# Patient Record
Sex: Male | Born: 1950 | Race: White | Hispanic: No | Marital: Single | Smoking: Former smoker
Health system: Southern US, Community
[De-identification: ages and names within clinical notes are randomized; demographics above are authoritative.]

## PROBLEM LIST (undated history)

## (undated) DIAGNOSIS — H905 Unspecified sensorineural hearing loss: Secondary | ICD-10-CM

## (undated) DIAGNOSIS — J449 Chronic obstructive pulmonary disease, unspecified: Secondary | ICD-10-CM

## (undated) DIAGNOSIS — C801 Malignant (primary) neoplasm, unspecified: Secondary | ICD-10-CM

## (undated) DIAGNOSIS — R972 Elevated prostate specific antigen [PSA]: Secondary | ICD-10-CM

## (undated) HISTORY — DX: Chronic obstructive pulmonary disease, unspecified: J44.9

## (undated) HISTORY — DX: Unspecified sensorineural hearing loss: H90.5

## (undated) HISTORY — PX: FRACTURE SURGERY: SHX138

---

## 2006-10-23 ENCOUNTER — Ambulatory Visit: Payer: Self-pay | Admitting: Internal Medicine

## 2006-11-24 ENCOUNTER — Ambulatory Visit: Payer: Self-pay | Admitting: Internal Medicine

## 2006-11-28 ENCOUNTER — Encounter: Payer: Self-pay | Admitting: Internal Medicine

## 2006-11-28 DIAGNOSIS — H905 Unspecified sensorineural hearing loss: Secondary | ICD-10-CM

## 2006-11-28 DIAGNOSIS — J449 Chronic obstructive pulmonary disease, unspecified: Secondary | ICD-10-CM | POA: Insufficient documentation

## 2007-01-26 ENCOUNTER — Ambulatory Visit: Payer: Self-pay | Admitting: Internal Medicine

## 2007-01-27 ENCOUNTER — Encounter (INDEPENDENT_AMBULATORY_CARE_PROVIDER_SITE_OTHER): Payer: Self-pay | Admitting: Internal Medicine

## 2007-01-27 LAB — CONVERTED CEMR LAB
Hemoglobin: 14.7 g/dL (ref 13.0–17.0)
Lymphocytes Relative: 36 % (ref 12–46)
Lymphs Abs: 2.5 10*3/uL (ref 0.7–3.3)
MCHC: 32.7 g/dL (ref 30.0–36.0)
Monocytes Absolute: 0.7 10*3/uL (ref 0.2–0.7)
Neutro Abs: 3.3 10*3/uL (ref 1.7–7.7)
Platelets: 242 10*3/uL (ref 150–400)
RBC: 4.82 M/uL (ref 4.22–5.81)
RDW: 13 % (ref 11.5–14.0)

## 2007-02-16 ENCOUNTER — Encounter (INDEPENDENT_AMBULATORY_CARE_PROVIDER_SITE_OTHER): Payer: Self-pay | Admitting: Internal Medicine

## 2007-02-22 ENCOUNTER — Encounter (INDEPENDENT_AMBULATORY_CARE_PROVIDER_SITE_OTHER): Payer: Self-pay | Admitting: Internal Medicine

## 2007-04-20 ENCOUNTER — Encounter (INDEPENDENT_AMBULATORY_CARE_PROVIDER_SITE_OTHER): Payer: Self-pay | Admitting: Internal Medicine

## 2010-11-27 ENCOUNTER — Inpatient Hospital Stay (HOSPITAL_COMMUNITY)
Admission: EM | Admit: 2010-11-27 | Discharge: 2010-11-29 | Payer: Self-pay | Source: Home / Self Care | Admitting: Emergency Medicine

## 2010-11-28 ENCOUNTER — Encounter: Payer: Self-pay | Admitting: Orthopedic Surgery

## 2010-12-02 ENCOUNTER — Ambulatory Visit: Payer: Self-pay | Admitting: Orthopedic Surgery

## 2010-12-02 DIAGNOSIS — S8263XA Displaced fracture of lateral malleolus of unspecified fibula, initial encounter for closed fracture: Secondary | ICD-10-CM | POA: Insufficient documentation

## 2010-12-02 HISTORY — DX: Displaced fracture of lateral malleolus of unspecified fibula, initial encounter for closed fracture: S82.63XA

## 2010-12-08 ENCOUNTER — Ambulatory Visit: Payer: Self-pay | Admitting: Orthopedic Surgery

## 2010-12-15 ENCOUNTER — Encounter: Payer: Self-pay | Admitting: Orthopedic Surgery

## 2010-12-16 ENCOUNTER — Encounter: Payer: Self-pay | Admitting: Orthopedic Surgery

## 2011-01-06 ENCOUNTER — Ambulatory Visit
Admission: RE | Admit: 2011-01-06 | Discharge: 2011-01-06 | Payer: Self-pay | Source: Home / Self Care | Attending: Orthopedic Surgery | Admitting: Orthopedic Surgery

## 2011-01-25 NOTE — Assessment & Plan Note (Signed)
Summary: HOSP FOL/UP RT ANKLE/OTIF POST OP/SURG 11/28/10/[employer]F   Visit Type:  Follow-up  CC:  post op 1.  History of Present Illness: I saw Jose Villa in the office today for a followup visit.  He is a 60 years old man with the complaint of:  post op 1 otif right ankle.  DOS 11/28/10.  POD 4.  Meds: Percocet,  no refill needed.  Today for a dressing change, splint change.  Using walker today, no weightbearing.   Jose Villa has some swelling and serous drainage from his wound.  His foot is in neutral position with slight 5 dorsiflexion and about 10 plantar flexion.  He is placed back into her posterior splint in the neutral position he is to follow up to get x-rays and staples out on the 14th.        Allergies: No Known Drug Allergies   Impression & Recommendations:  Problem # 1:  AFTERCARE FOLLOW SURGERY MUSCULOSKEL SYSTEM NEC (ICD-V58.78)  Orders: Post-Op Check (40981)  Problem # 2:  CLOSED FRACTURE OF LATERAL MALLEOLUS (ICD-824.2)  Orders: Post-Op Check (19147) Short Arm Splint Static (82956)  Patient Instructions: 1)  return on the 14th for stples out and xrays  2)  no weight bearing    Orders Added: 1)  Post-Op Check [99024] 2)  Short Arm Splint Static [29125]

## 2011-01-27 NOTE — Assessment & Plan Note (Signed)
Summary: 4 WK RE-CK/XRAYS OUT OF CAST RT ANKLE/POST OP 11/28/10/[employer]BS/CAF   Visit Type:  Follow-up  CC:  right ankle fracture.  History of Present Illness: I saw Jose Villa in the office today for a followup visit.  He is a 60 years old man with the complaint of:  right ankle fracture  Post op 1 otif right ankle.  DOS 11/28/10.  this is 5 weeks and 4 days since his surgery  Meds: Percocet 5-325 Mg Tabs (Oxycodone-acetaminophen) .Marland Kitchen.. 1 q 4 as needed pain  Today for xrays OOP  Complaints: He ran out of pain medicine and has been having pain.  His x-rays are taken again today after his wound was examined and found to be healing nicely  Separately identifiable x-ray report 3 views RIGHT ankle  Lateral fixation an interfrag screw was noted in the lateral malleolar fracture  The ankle mortise is intact  Impression healing ankle fracture with internal fixation  The patient is placed in an air cast allowed to weight-bear as tolerated in a regular shoe or boot and come back for x-rays in one month  Allergies: No Known Drug Allergies   Impression & Recommendations:  Problem # 1:  AFTERCARE FOLLOW SURGERY MUSCULOSKEL SYSTEM NEC (ICD-V58.78)  Orders: Post-Op Check ([address]) Ankle x-ray complete,  minimum 3 views (60454)  Problem # 2:  CLOSED FRACTURE OF LATERAL MALLEOLUS (ICD-824.2)  Orders: Post-Op Check (09811) Ankle x-ray complete,  minimum 3 views (91478)  Patient Instructions: 1)  WBAT in the brace use the walker as needed  2)  return in 4 weeks for xrays right ankle  Prescriptions: PERCOCET 5-325 MG TABS (OXYCODONE-ACETAMINOPHEN) 1 q 4 as needed pain  #60 x 0   Entered and Authorized by:   Fuller Canada MD   Signed by:   Fuller Canada MD on 01/06/2011   Method used:   Print then Give to Patient   RxID:   [id number]    Orders Added: 1)  Post-Op Check [62952] 2)  Ankle x-ray complete,  minimum 3 views [84132]

## 2011-01-27 NOTE — Letter (Signed)
Summary: FMLA FORM   FMLA FORM   Imported By: Eugenio Hoes 12/29/2010 12:00:11  _____________________________________________________________________  External Attachment:    Type:   Image     Comment:   External Document

## 2011-01-27 NOTE — Letter (Signed)
Summary: work note faxed to employer  work note faxed to employer   Imported By: Jacklynn Ganong 12/21/2010 10:25:24  _____________________________________________________________________  External Attachment:    Type:   Image     Comment:   External Document

## 2011-01-27 NOTE — Letter (Signed)
Summary: Out of Work  [facility]  [address]  [place], [place] [address]   Phone: [phone]  Fax: [phone]    December 08, 2010   Employee:  Jose Villa    To Whom It May Concern:   For Medical reasons, please excuse the above named employee from work for the following dates:  Start:   11/27/10                    End/Estimated out of work:  12 weeks    Approxiimate return to work date:  02/28/11     If you need additional information, please feel free to contact our office.         Sincerely,    Terrance Mass, MD

## 2011-01-27 NOTE — Assessment & Plan Note (Signed)
Summary: RE-CK/XRAY RT ANKLE/POST OP 11/28/10/[employer]F   Visit Type:  Follow-up  CC:  post op ankle.  History of Present Illness: I saw Jose Villa in the office today for a followup visit.  He is a 60 years old man with the complaint of:  post op 1 otif right ankle.  DOS 11/28/10.  POD 10  Meds: Percocet takes 2 sometimes, needs refill.  Today for staple removal, xrays, dressing change.  Using walker today, no weightbearing.  Painlevel is 6 now.  Wound looks good. Staples were removed. Steri-Strips were applied.  Swelling has gone down to allow cast application.  Patient placed in nonweiof the RIGHT ankle. There is a plate, fixing a lateral malleolar fracture with interfrag screw. Overall alignment looks good. Fracture appears to be in stable condition. impression stable fixation, RIGHT ankle fracture           Allergies: No Known Drug Allergies   Impression & Recommendations:  Problem # 1:  AFTERCARE FOLLOW SURGERY MUSCULOSKEL SYSTEM NEC (ICD-V58.78) Assessment Improved  Orders: Post-Op Check (04540) Ankle x-ray complete,  minimum 3 views (98119)  Problem # 2:  CLOSED FRACTURE OF LATERAL MALLEOLUS (ICD-824.2) Assessment: Improved  Orders: Post-Op Check (14782) Ankle x-ray complete,  minimum 3 views (95621)  Medications Added to Medication List This Visit: 1)  Percocet 5-325 Mg Tabs (Oxycodone-acetaminophen) .Marland Kitchen.. 1 q 4 as needed pain  Patient Instructions: 1)  no weight bearing  2)  return in 4 weeks x-rays OUT OF CAST  Prescriptions: PERCOCET 5-325 MG TABS (OXYCODONE-ACETAMINOPHEN) 1 q 4 as needed pain  #40 x 0   Entered and Authorized by:   Fuller Canada MD   Signed by:   Fuller Canada MD on 12/08/2010   Method used:   Print then Give to Patient   RxID:   [id number]    Orders Added: 1)  Post-Op Check [41324] 2)  Ankle x-ray complete,  minimum 3 views [40102]

## 2011-02-08 ENCOUNTER — Ambulatory Visit (INDEPENDENT_AMBULATORY_CARE_PROVIDER_SITE_OTHER): Payer: BC Managed Care – PPO | Admitting: Orthopedic Surgery

## 2011-02-08 ENCOUNTER — Encounter: Payer: Self-pay | Admitting: Orthopedic Surgery

## 2011-02-08 DIAGNOSIS — S8263XA Displaced fracture of lateral malleolus of unspecified fibula, initial encounter for closed fracture: Secondary | ICD-10-CM

## 2011-02-08 DIAGNOSIS — Z4789 Encounter for other orthopedic aftercare: Secondary | ICD-10-CM

## 2011-02-16 NOTE — Assessment & Plan Note (Signed)
Summary: 4 wk reck/xr rt ankle post op 11/28/10/bcbs/caf   Visit Type:  Follow-up  CC:  fx care post op.  History of Present Illness: I saw Jose Villa in the office today for a followup visit.  He is a 60 years old man with the complaint of:  right ankle fracture  Post op  otif right ankle.  DOS 11/28/10.  Meds :nothing for pain.  Today for xrays since been in air cast for a month.  Wears sometimes.  No problems, not much pain.  Ankle looks good. We took x-rays today.  X-ray show that the fracture is healed.  He can return to work on the 27th of as needed    Allergies: No Known Drug Allergies  Physical Exam  Additional Exam:  ankle range of motion 10 dorsiflexion, 20, plantar flexion. Incision is good. No swelling.  No tenderness at the fracture   Impression & Recommendations:  Problem # 1:  AFTERCARE FOLLOW SURGERY MUSCULOSKEL SYSTEM NEC (ICD-V58.78)  Separate and Identifiable X-Ray report       3 views right ankle   Lateral plate with interfragmentary screw fixating lateral malleolar fracture, which is healed. Mortise is intact.  Impression healed ankle fracture, RIGHT ankle.  Orders: Post-Op Check [phone]) Ankle x-ray complete,  minimum 3 views (60454)  Problem # 2:  CLOSED FRACTURE OF LATERAL MALLEOLUS (ICD-824.2) Assessment: Comment Only  Orders: Post-Op Check (09811) Ankle x-ray complete,  minimum 3 views (91478)  Patient Instructions: 1)  RETURN TO WORK ON THE 27TH OF FEBRUARY    Orders Added: 1)  Post-Op Check [99024] 2)  Ankle x-ray complete,  minimum 3 views [29562]

## 2011-02-16 NOTE — Letter (Signed)
Summary: Return to work form  Return to work form   Imported By: Jacklynn Ganong 02/08/2011 15:53:15  _____________________________________________________________________  External Attachment:    Type:   Image     Comment:   External Document

## 2011-02-16 NOTE — Letter (Signed)
Summary: Out of Work  [facility]  [address]  [place], [place] [address]   Phone: [phone]  Fax: [phone]    February 08, 2011   Employee:  Jose Villa    To Whom It May Concern:   For Medical reasons, please excuse the above named employee from work for the following dates:  Start:  11/27/10       End/Return to work:   02/21/11, full duty, no restrictions.   If you need additional information, please feel free to contact our office.         Sincerely,    Terrance Mass, MD

## 2011-03-08 LAB — PROTIME-INR: Prothrombin Time: 13.1 seconds (ref 11.6–15.2)

## 2011-03-08 LAB — CBC
Hemoglobin: 14.5 g/dL (ref 13.0–17.0)
MCHC: 35.6 g/dL (ref 30.0–36.0)
Platelets: 262 10*3/uL (ref 150–400)
RBC: 4.35 MIL/uL (ref 4.22–5.81)
WBC: 11 10*3/uL — ABNORMAL HIGH (ref 4.0–10.5)

## 2011-03-08 LAB — BASIC METABOLIC PANEL
BUN: 11 mg/dL (ref 6–23)
CO2: 29 mEq/L (ref 19–32)
Calcium: 9.1 mg/dL (ref 8.4–10.5)
Chloride: 99 mEq/L (ref 96–112)
GFR calc non Af Amer: >60 mL/min (ref >60)
Sodium: 133 mEq/L — ABNORMAL LOW (ref 135–145)

## 2011-03-08 LAB — DIFFERENTIAL
Basophils Absolute: 0.1 10*3/uL (ref 0.0–0.1)
Eosinophils Absolute: 0.1 10*3/uL (ref 0.0–0.7)
Eosinophils Relative: 1 % (ref 0–5)
Monocytes Absolute: 1.1 10*3/uL — ABNORMAL HIGH (ref 0.1–1.0)
Monocytes Relative: 10 % (ref 3–12)
Neutrophils Relative %: 70 % (ref 43–77)

## 2011-08-30 ENCOUNTER — Encounter: Payer: Self-pay | Admitting: Family Medicine

## 2011-08-30 ENCOUNTER — Ambulatory Visit (INDEPENDENT_AMBULATORY_CARE_PROVIDER_SITE_OTHER): Payer: BC Managed Care – PPO | Admitting: Family Medicine

## 2011-08-30 VITALS — BP 124/90 | HR 83 | Resp 16 | Ht 71.0 in | Wt 203.1 lb

## 2011-08-30 DIAGNOSIS — Z720 Tobacco use: Secondary | ICD-10-CM

## 2011-08-30 DIAGNOSIS — F17200 Nicotine dependence, unspecified, uncomplicated: Secondary | ICD-10-CM

## 2011-08-30 DIAGNOSIS — H6120 Impacted cerumen, unspecified ear: Secondary | ICD-10-CM

## 2011-08-30 DIAGNOSIS — J449 Chronic obstructive pulmonary disease, unspecified: Secondary | ICD-10-CM

## 2011-08-30 MED ORDER — ALBUTEROL SULFATE HFA 108 (90 BASE) MCG/ACT IN AERS: 2.0000 | INHALATION_SPRAY | RESPIRATORY_TRACT | Status: DC | PRN

## 2011-08-30 MED ORDER — CARBAMIDE PEROXIDE 6.5 % OT SOLN: 5.0000 [drp] | Freq: Two times a day (BID) | OTIC | Status: DC

## 2011-08-30 MED ORDER — VARENICLINE TARTRATE 0.5 MG X 11 & 1 MG X 42 PO MISC: ORAL | Status: AC

## 2011-08-30 NOTE — Progress Notes (Signed)
  Subjective:    Patient ID: Jose Villa, male    DOB: [birth year], 60 y.o.   MRN: [id number]  HPI  Here to establish care, medications and history reviewed   He has a DOT exam coming up and wanted to establish care, he would like to have his ears cleaned out today   Works as a heavy equipment--   Tobacco- tried chantix in the past, but had problem with his breathing and coughing in the past after he quit for approx 3 months. Smokes 1.5ppd > 30 years , would like to try again   COPD- coughs more with humidity and weather, occ has cough while he is smoking, typically does not have a lot of phlegm production, no current inhalers, his family has been worried about his breathing, he has not had formal work-up   Hearing loss- feels like he has wax build-up today, he has less hearing on right side compared to left, history of hearing loss per chart he, has passed his DOT exams and hearing exams since that diagnosis, occ has pain in right ear   No history of DM, HTN  Review of Systems   GEN- denies fatigue, fever, weight loss,weakness, recent illness HEENT- denies eye drainage, change in vision, nasal discharge, CVS- denies chest pain, palpitations RESP- denies SOB, cough, wheeze ABD- denies N/V, change in stools, abd pain GU- denies dysuria, hematuria, dribbling, incontinence MSK- denies joint pain, muscle aches, injury Neuro- denies headache, dizziness, syncope, seizure activity      Objective:   Physical Exam  GEN- NAD, alert and oriented x3 HEENT- PERRL, EOMI, non injected sclera, pink conjunctiva, MMM, oropharynx clear, fundoscopic exam benign, impacted wax bilat  Neck- Supple, no thryomegaly CVS- RRR, no murmur RESP-bilat expiratory and inspiratory wheeze, fair air movement, no rhonchi, normal WOB, oxygen sat 93% RA, increased end expiratory time EXT- No edema Pulses- Radial, DP- 2+        Assessment & Plan:

## 2011-08-30 NOTE — Assessment & Plan Note (Signed)
Start Chantix patient will have a quit date in mind. He understands he not cannot continue to smoke while on this medication

## 2011-08-30 NOTE — Assessment & Plan Note (Signed)
Debrox drops for 3 days and return to have ears cleaned out. He will have formal hearing exam as a part of his DOT exam in [facility]

## 2011-08-30 NOTE — Patient Instructions (Addendum)
Please get the PFT done as soon as possible Use the albuterol as needed every 4 hours for cough or wheeze I recommend that you quit smoking!! Start the Chantix For your ears- use the debrox- wax drops in both ears , then return for a nurse visit on Friday to have them cleaned out Return for a f/u after your PFT are done. You will need to schedule a physical exam

## 2011-08-30 NOTE — Assessment & Plan Note (Signed)
He has not been followed for a COPD and continues to smoke a large quantity daily. I will start him on albuterol nhaler at this time. 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 I'm sure he will need a maintenance inhaler. His oxygen levels currently low normal tensive and he is not hypoxic or in any respiratory distress at this time.

## 2011-09-12 ENCOUNTER — Ambulatory Visit (HOSPITAL_COMMUNITY)
Admission: RE | Admit: 2011-09-12 | Discharge: 2011-09-12 | Disposition: A | Discharge status: Home / Self Care | Payer: BC Managed Care – PPO | Source: Ambulatory Visit | Attending: Family Medicine | Admitting: Family Medicine

## 2011-09-12 DIAGNOSIS — R0609 Other forms of dyspnea: Secondary | ICD-10-CM | POA: Insufficient documentation

## 2011-09-12 DIAGNOSIS — J449 Chronic obstructive pulmonary disease, unspecified: Secondary | ICD-10-CM | POA: Insufficient documentation

## 2011-09-12 DIAGNOSIS — R0989 Other specified symptoms and signs involving the circulatory and respiratory systems: Secondary | ICD-10-CM | POA: Insufficient documentation

## 2011-09-12 DIAGNOSIS — J4489 Other specified chronic obstructive pulmonary disease: Secondary | ICD-10-CM | POA: Insufficient documentation

## 2011-09-12 LAB — PULMONARY FUNCTION TEST

## 2011-09-12 MED ORDER — ALBUTEROL SULFATE (5 MG/ML) 0.5% IN NEBU
2.5000 mg | INHALATION_SOLUTION | Freq: Once | RESPIRATORY_TRACT | Status: AC
  Administered 2011-09-12: 2.5 mg via RESPIRATORY_TRACT

## 2011-09-15 NOTE — Procedures (Signed)
NAME:  SHALAMAR, CRAYS NO.:  [id number]  MEDICAL RECORD NO.:  [id number]  LOCATION:  RESP                          FACILITY:  [facility]  PHYSICIAN:  Kieon Lawhorn L. Juanetta Gosling, M.D.DATE OF BIRTH:  08-05-1951  DATE OF PROCEDURE: DATE OF DISCHARGE:  09/12/2011                           PULMONARY FUNCTION TEST   REASON FOR PULMONARY FUNCTION TESTING:  COPD. 1. Spirometry shows a severe ventilatory defect with evidence of     airflow obstruction. 2. Lung volumes are normal. 3. DLCO is normal. 4. There is improvement with inhaled bronchodilator, but it does not     reach the level of significance. 5. This is consistent with clinical diagnosis of COPD.     Kailin Leu L. Juanetta Gosling, M.D.     ELH/MEDQ  D:  09/15/2011  T:  09/15/2011  Job:  [id number]  cc:   Milus Mallick. Lodema Hong, M.D. Fax: [phone]

## 2011-09-26 ENCOUNTER — Encounter: Payer: Self-pay | Admitting: Family Medicine

## 2011-09-26 ENCOUNTER — Ambulatory Visit (INDEPENDENT_AMBULATORY_CARE_PROVIDER_SITE_OTHER): Payer: BC Managed Care – PPO | Admitting: Family Medicine

## 2011-09-26 DIAGNOSIS — Z720 Tobacco use: Secondary | ICD-10-CM

## 2011-09-26 DIAGNOSIS — J449 Chronic obstructive pulmonary disease, unspecified: Secondary | ICD-10-CM

## 2011-09-26 DIAGNOSIS — F17200 Nicotine dependence, unspecified, uncomplicated: Secondary | ICD-10-CM

## 2011-09-26 DIAGNOSIS — Z7689 Persons encountering health services in other specified circumstances: Secondary | ICD-10-CM | POA: Insufficient documentation

## 2011-09-26 DIAGNOSIS — E669 Obesity, unspecified: Secondary | ICD-10-CM | POA: Insufficient documentation

## 2011-09-26 DIAGNOSIS — Z125 Encounter for screening for malignant neoplasm of prostate: Secondary | ICD-10-CM

## 2011-09-26 MED ORDER — FLUTICASONE-SALMETEROL 250-50 MCG/DOSE IN AEPB: 1.0000 | INHALATION_SPRAY | Freq: Two times a day (BID) | RESPIRATORY_TRACT | Status: DC

## 2011-09-26 NOTE — Patient Instructions (Signed)
Schedule a physical exam for Jan or Feb 2012 Start the Advair twice a day- rinse your mouth after use  Get your blood work done before the physical- do not eat after midnight  Chronic Obstructive Pulmonary Disease (COPD) Chronic obstructive pulmonary disease (COPD) is a condition in which airflow from the lungs is restricted. The lungs can never return to normal, but there are measures you can take which will improve them and make you feel better. CAUSES  Smoking.   Breathing in irritants (pollution, cigarette smoke, strong odors, aerosol sprays, paint fumes).   History of lung infections.  TREATMENT  Treatment focuses on making you comfortable (supportive care).  HOME CARE INSTRUCTIONS  If you smoke, stop smoking. The carbon monoxide buildup in the blood robs you of your already short oxygen supply.   Take medicines (antibiotics) that kill germs as directed.   Avoid antihistamines and cough syrups. They dry up your system and slow down the elimination of secretions. This decreases respiratory capacity and may lead to infections.   Drink enough water and fluids to keep your urine clear or pale yellow. This loosens secretions.   Use humidifiers at home and at your bedside if they do not make breathing difficult.   Receive all protective vaccines your caregiver suggests, especially pneumococcal and influenza.   Use home oxygen as suggested.  SEEK MEDICAL CARE IF:  You develop pus-like mucus (sputum).   You have an oral temperature above 101F.   Breathing is more labored or exercise becomes difficult to do.   You are running out of the medicine you take for your breathing.  SEEK IMMEDIATE MEDICAL CARE IF:  You have a rapid heart rate.   You have agitation, confusion, tremors, or are in a stupor (family members may need to observe this).   It becomes difficult to breathe.   You develop chest pain.   You have an oral temperature above 101F, not controlled by medicine.    MAKE SURE YOU:    Understand these instructions.   Will watch your condition.   Will get help right away if you are not doing well or get worse.  Document Released: 09/21/2005 Document Re-Released: 03/08/2010 [facility] Patient Information 2011 [place], [place].

## 2011-09-26 NOTE — Progress Notes (Signed)
  Subjective:    Patient ID: Jose Villa, male    DOB: 01/27/1951, 60 y.o.   MRN: [id number]  HPI  Pt here to f/u COPD and PFT PFT showed severe defect, positive for COPD, little reversal with bronchodilator Feels good overall Has not smoked in 2 weeks, on Chantix Used albuterol a few times  Review of Systems    GEN- No fever, no weight loss, +fatigue since he quit smoking     CVS- no CP, no palipitations, no leg edema     RESP-  +cough, +wheeze, +SOB    Objective:   Physical Exam  GEN- NAD, alert and oriented x3 CVS- RRR, no murmur RESP-bilat expiratory and inspiratory wheeze, fair air movement, no rhonchi, normal WOB,  EXT- No edema       Assessment & Plan:

## 2011-09-27 NOTE — Assessment & Plan Note (Signed)
Will start advair for maintenance. Pt declined flu shot, albuterol prn Discussed long term effects of tobacco

## 2011-09-27 NOTE — Assessment & Plan Note (Signed)
Pt congratulated on tobacco cessation, continue Chantix

## 2011-10-05 ENCOUNTER — Encounter: Payer: Self-pay | Admitting: Family Medicine

## 2011-12-16 LAB — LIPID PANEL
Cholesterol: 180 mg/dL (ref 0–200)
HDL: 38 mg/dL — ABNORMAL LOW (ref >39)
Total CHOL/HDL Ratio: 4.7 Ratio
Triglycerides: 78 mg/dL (ref <150)

## 2011-12-16 LAB — COMPREHENSIVE METABOLIC PANEL
AST: 16 U/L (ref 0–37)
Albumin: 4.7 g/dL (ref 3.5–5.2)
Alkaline Phosphatase: 70 U/L (ref 39–117)
Calcium: 9.7 mg/dL (ref 8.4–10.5)
Glucose, Bld: 88 mg/dL (ref 70–99)
Total Protein: 7 g/dL (ref 6.0–8.3)

## 2011-12-16 LAB — PSA: PSA: 1.04 ng/mL (ref <=4.00)

## 2012-04-29 IMAGING — CR DG CHEST 1V PORT
1 series · 1 of 1 positions shown · non-contrast
Comparison: None.

CLINICAL DATA: Preoperative respiratory exam.  Ankle fracture.

PORTABLE CHEST - 1 VIEW

[view not recorded]
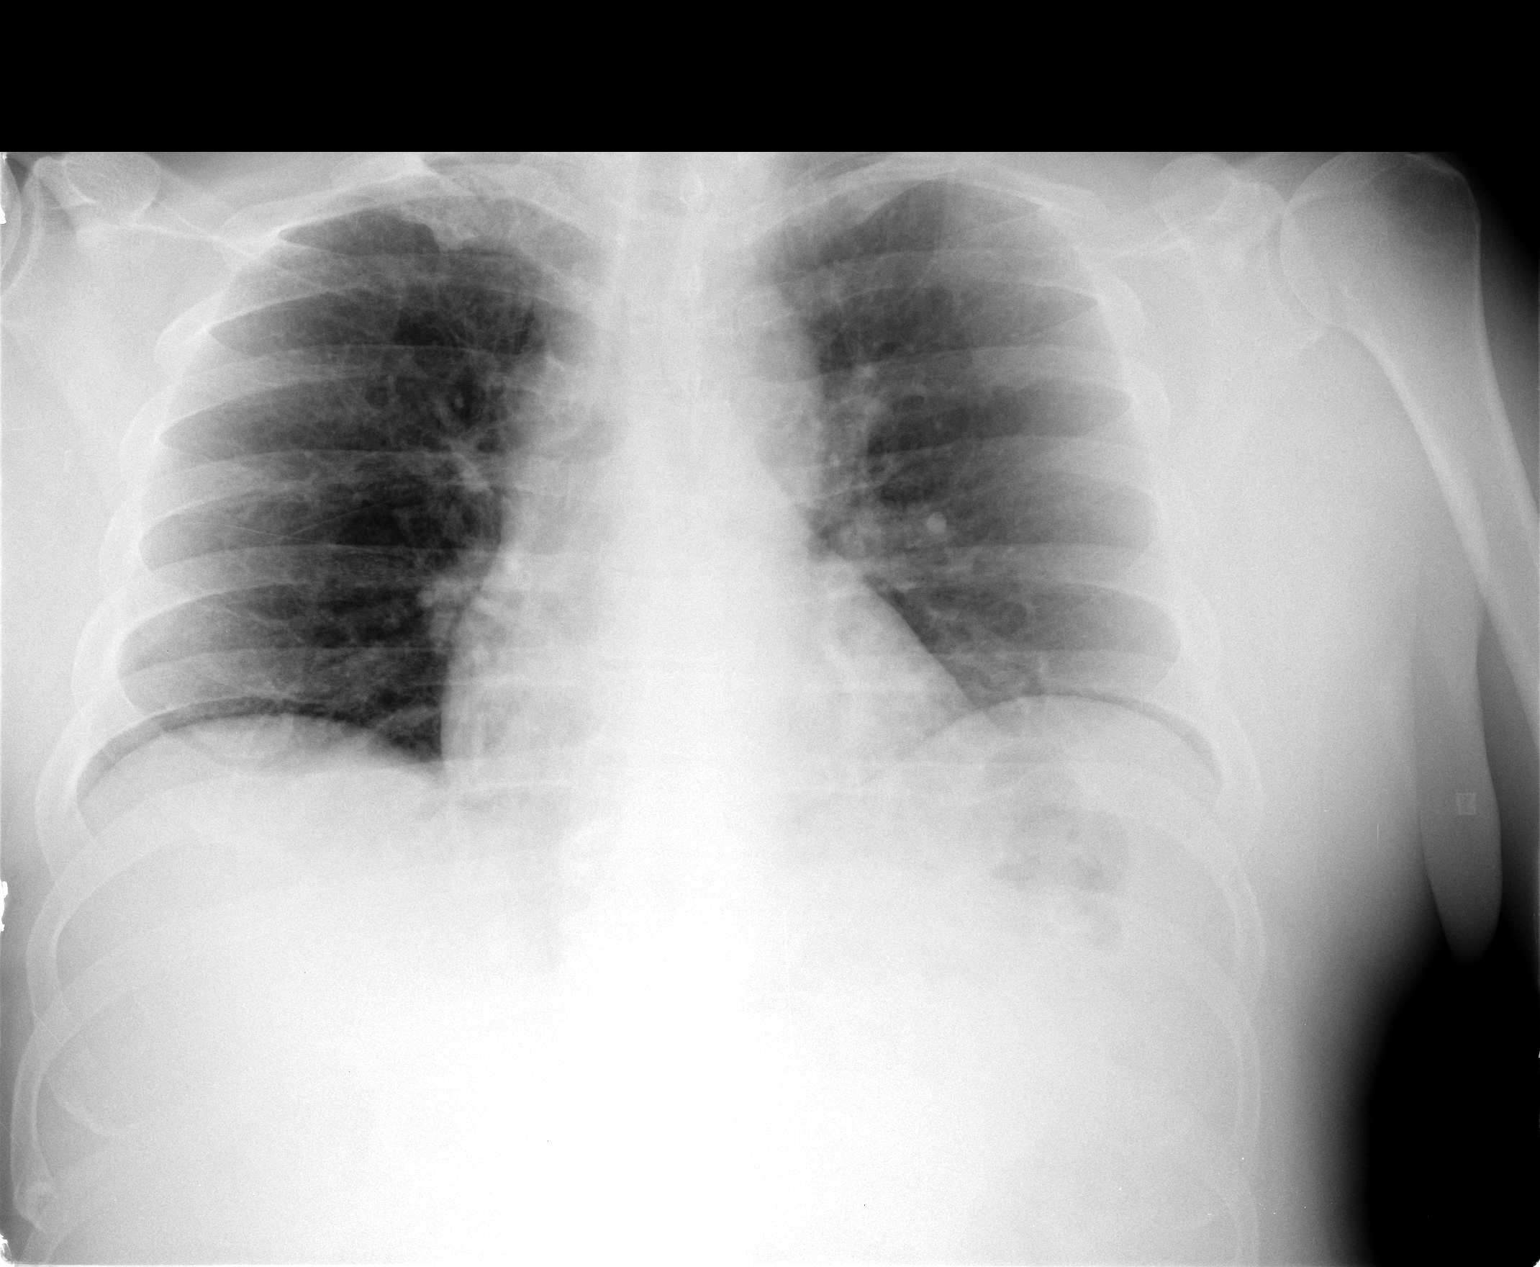

[1 of 1 positions shown; findings below may reference images not displayed]

FINDINGS: The heart size and vascularity are normal and the lungs
are clear.  No significant osseous abnormality.
IMPRESSION: No acute disease in the chest.

## 2019-02-13 ENCOUNTER — Encounter (HOSPITAL_COMMUNITY): Payer: Self-pay | Admitting: Emergency Medicine

## 2019-02-13 ENCOUNTER — Emergency Department (HOSPITAL_COMMUNITY)
Admission: EM | Admit: 2019-02-13 | Discharge: 2019-02-13 | Discharge status: Against Medical Advice | Payer: BLUE CROSS/BLUE SHIELD | Attending: Emergency Medicine | Admitting: Emergency Medicine

## 2019-02-13 ENCOUNTER — Other Ambulatory Visit: Payer: Self-pay

## 2019-02-13 DIAGNOSIS — J441 Chronic obstructive pulmonary disease with (acute) exacerbation: Secondary | ICD-10-CM | POA: Diagnosis not present

## 2019-02-13 DIAGNOSIS — Z87891 Personal history of nicotine dependence: Secondary | ICD-10-CM | POA: Insufficient documentation

## 2019-02-13 DIAGNOSIS — J181 Lobar pneumonia, unspecified organism: Secondary | ICD-10-CM | POA: Diagnosis not present

## 2019-02-13 DIAGNOSIS — Z79899 Other long term (current) drug therapy: Secondary | ICD-10-CM | POA: Diagnosis not present

## 2019-02-13 DIAGNOSIS — J189 Pneumonia, unspecified organism: Secondary | ICD-10-CM

## 2019-02-13 DIAGNOSIS — R0602 Shortness of breath: Secondary | ICD-10-CM | POA: Diagnosis present

## 2019-02-13 LAB — CBC WITH DIFFERENTIAL/PLATELET
Abs Immature Granulocytes: 0.08 10*3/uL — ABNORMAL HIGH (ref 0.00–0.07)
Basophils Absolute: 0 10*3/uL (ref 0.0–0.1)
Basophils Relative: 0 %
Eosinophils Absolute: 0 10*3/uL (ref 0.0–0.5)
Eosinophils Relative: 0 %
HCT: 52.2 % — ABNORMAL HIGH (ref 39.0–52.0)
Hemoglobin: 16.7 g/dL (ref 13.0–17.0)
Immature Granulocytes: 1 %
Lymphocytes Relative: 6 %
Lymphs Abs: 0.4 10*3/uL — ABNORMAL LOW (ref 0.7–4.0)
MCH: 32.4 pg (ref 26.0–34.0)
MCHC: 32 g/dL (ref 30.0–36.0)
MCV: 101.4 fL — ABNORMAL HIGH (ref 80.0–100.0)
Monocytes Absolute: 0.2 10*3/uL (ref 0.1–1.0)
Monocytes Relative: 3 %
Neutro Abs: 6.5 10*3/uL (ref 1.7–7.7)
Neutrophils Relative %: 90 %
Platelets: 157 10*3/uL (ref 150–400)
RBC: 5.15 MIL/uL (ref 4.22–5.81)
RDW: 13 % (ref 11.5–15.5)
WBC: 7.2 10*3/uL (ref 4.0–10.5)
nRBC: 0 % (ref 0.0–0.2)

## 2019-02-13 LAB — BRAIN NATRIURETIC PEPTIDE: B Natriuretic Peptide: 34 pg/mL (ref 0.0–100.0)

## 2019-02-13 LAB — BASIC METABOLIC PANEL
Anion gap: 11 (ref 5–15)
BUN: 13 mg/dL (ref 8–23)
CO2: 30 mmol/L (ref 22–32)
Calcium: 8.8 mg/dL — ABNORMAL LOW (ref 8.9–10.3)
Chloride: 94 mmol/L — ABNORMAL LOW (ref 98–111)
Creatinine, Ser: 0.79 mg/dL (ref 0.61–1.24)
GFR calc Af Amer: >60 mL/min (ref >60)
GFR calc non Af Amer: >60 mL/min (ref >60)
Glucose, Bld: 201 mg/dL — ABNORMAL HIGH (ref 70–99)
Potassium: 3.6 mmol/L (ref 3.5–5.1)
Sodium: 135 mmol/L (ref 135–145)

## 2019-02-13 LAB — D-DIMER, QUANTITATIVE (NOT AT ARMC): D-Dimer, Quant: 0.47 ug/mL-FEU (ref 0.00–0.50)

## 2019-02-13 MED ORDER — SODIUM CHLORIDE 0.9 % IV SOLN
1.0000 g | Freq: Once | INTRAVENOUS | Status: AC
  Administered 2019-02-13: 1 g via INTRAVENOUS
  Filled 2019-02-13: qty 10

## 2019-02-13 MED ORDER — ALBUTEROL SULFATE HFA 108 (90 BASE) MCG/ACT IN AERS
2.0000 | INHALATION_SPRAY | Freq: Once | RESPIRATORY_TRACT | Status: AC
  Administered 2019-02-13: 2 via RESPIRATORY_TRACT
  Filled 2019-02-13: qty 6.7

## 2019-02-13 MED ORDER — AZITHROMYCIN 250 MG PO TABS
500.0000 mg | ORAL_TABLET | Freq: Once | ORAL | Status: AC
  Administered 2019-02-13: 500 mg via ORAL
  Filled 2019-02-13: qty 2

## 2019-02-13 MED ORDER — IPRATROPIUM BROMIDE 0.02 % IN SOLN
0.5000 mg | Freq: Once | RESPIRATORY_TRACT | Status: AC
  Administered 2019-02-13: 0.5 mg via RESPIRATORY_TRACT
  Filled 2019-02-13: qty 2.5

## 2019-02-13 MED ORDER — ALBUTEROL (5 MG/ML) CONTINUOUS INHALATION SOLN
10.0000 mg/h | INHALATION_SOLUTION | RESPIRATORY_TRACT | Status: DC
  Administered 2019-02-13: 10 mg/h via RESPIRATORY_TRACT
  Filled 2019-02-13: qty 20

## 2019-02-13 MED ORDER — METHYLPREDNISOLONE SODIUM SUCC 125 MG IJ SOLR
62.5000 mg | Freq: Once | INTRAMUSCULAR | Status: AC
  Administered 2019-02-13: 62.5 mg via INTRAVENOUS
  Filled 2019-02-13: qty 2

## 2019-02-13 NOTE — ED Notes (Addendum)
Pt found not on 4L o2. sats were 78% on RA. Did not c/o any cp or sob.   o2 admisinister via [place]. o2 was 87%.  RN placed pt on nrb. sats now 90%.

## 2019-02-13 NOTE — ED Provider Notes (Signed)
[facility] EMERGENCY DEPARTMENT Provider Note   CSN: [id number] Arrival date & time: 02/13/19  1446    History   Chief Complaint Chief Complaint  Patient presents with  . Shortness of Breath    HPI Jose Villa is a 68 y.o. male.     HPI   68yM with dyspnea and cough. Worsening over the past week. Seen at [facility] and had CXR with reported R basilar infiltrate. Prescribed multiple meds including augmentin, proventil, tamiflu and decadron but also told to come to the ED because of hypoxemia. He denies fever. No unusual leg pain or swelling. Hx of COPD. Not on supplemental oxygen.   Past Medical History:  Diagnosis Date  . COPD (chronic obstructive pulmonary disease) (HCC)   . Hearing loss, central     Patient Active Problem List   Diagnosis Date Noted  . Obesity 09/26/2011  . Prostate cancer screening 09/26/2011  . Tobacco abuse 08/30/2011  . Cerumen impaction 08/30/2011  . CLOSED FRACTURE OF LATERAL MALLEOLUS 12/02/2010  . 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 COPD 11/28/2006    Past Surgical History:  Procedure Laterality Date  . FRACTURE SURGERY     right leg        Home Medications    Prior to Admission medications   Medication Sig Start Date End Date Taking? Authorizing Provider  albuterol (VENTOLIN HFA) 108 (90 BASE) MCG/ACT inhaler Inhale 2 puffs into the lungs every 4 (four) hours as needed for wheezing. 08/30/11 02/13/19 Yes Kerri Perches, MD  cholecalciferol (VITAMIN D3) 25 MCG (1000 UT) tablet Take 1,000 Units by mouth daily.   Yes [provider]  Omega-3 Fatty Acids (FISH OIL) 1000 MG CAPS Take 1 capsule by mouth daily.   Yes [provider]    Family History Family History  Problem Relation Age of Onset  . Cancer Mother   . Asthma Sister     Social History Social History   Tobacco Use  . Smoking status: Former Smoker    Packs/day: 1.00    Types: Cigarettes    Last attempt to quit: 09/07/2011   Years since quitting: 7.4  . Smokeless tobacco: Never Used  Substance Use Topics  . Alcohol use: Yes    Comment: one beer a day  . Drug use: No     Allergies   Coconut oil fatty acid diethanolamide [cocamide dea]   Review of Systems Review of Systems  All systems reviewed and negative, other than as noted in HPI.  Physical Exam Updated Vital Signs BP (!) 155/73 (BP Location: Right Arm)   Pulse (!) 120   Temp (!) 97.5 F (36.4 C) (Oral)   Resp 20   Ht 5\' 11"  (1.803 m)   Wt 96.2 kg   SpO2 (!) 73%   BMI 29.57 kg/m   Physical Exam Vitals signs and nursing note reviewed.  Constitutional:      General: He is not in acute distress.    Appearance: He is well-developed.  HENT:     Head: Normocephalic and atraumatic.  Eyes:     General:        Right eye: No discharge.        Left eye: No discharge.     Conjunctiva/sclera: Conjunctivae normal.  Neck:     Musculoskeletal: Neck supple.  Cardiovascular:     Rate and Rhythm: Regular rhythm. Tachycardia present.     Heart sounds: Normal heart sounds. No murmur. No friction rub. No gallop.  Pulmonary:     Effort: Pulmonary effort is normal. No respiratory distress.     Breath sounds: Wheezing present.  Abdominal:     General: There is no distension.     Palpations: Abdomen is soft.     Tenderness: There is no abdominal tenderness.  Musculoskeletal:        General: No tenderness.     Right lower leg: No edema.     Left lower leg: No edema.  Skin:    General: Skin is warm and dry.  Neurological:     Mental Status: He is alert.  Psychiatric:        Behavior: Behavior normal.        Thought Content: Thought content normal.      ED Treatments / Results  Labs (all labs ordered are listed, but only abnormal results are displayed) Labs Reviewed  CBC WITH DIFFERENTIAL/PLATELET - Abnormal; Notable for the following components:      Result Value   HCT 52.2 (*)    MCV 101.4 (*)    Lymphs Abs 0.4 (*)    Abs Immature  Granulocytes 0.08 (*)    All other components within normal limits  BASIC METABOLIC PANEL - Abnormal; Notable for the following components:   Chloride 94 (*)    Glucose, Bld 201 (*)    Calcium 8.8 (*)    All other components within normal limits  BRAIN NATRIURETIC PEPTIDE  D-DIMER, QUANTITATIVE (NOT AT [facility])    EKG EKG Interpretation  Date/Time:  Wednesday February 13 2019 15:11:34 EST Ventricular Rate:  110 PR Interval:    QRS Duration: 106 QT Interval:  346 QTC Calculation: 468 R Axis:   128 Text Interpretation:  Sinus tachycardia Multiple ventricular premature complexes Right axis deviation Confirmed by Raeford Razor [id number]) on 02/13/2019 3:26:24 PM   Radiology No results found.  Procedures Procedures (including critical care time) CRITICAL CARE Performed by: Raeford Razor Total critical care time: 35 minutes Critical care time was exclusive of separately billable procedures and treating other patients. Critical care was necessary to treat or prevent imminent or life-threatening deterioration. Critical care was time spent personally by me on the following activities: development of treatment plan with patient and/or surrogate as well as nursing, discussions with consultants, evaluation of patient's response to treatment, examination of patient, obtaining history from patient or surrogate, ordering and performing treatments and interventions, ordering and review of laboratory studies, ordering and review of radiographic studies, pulse oximetry and re-evaluation of patient's condition.  Medications Ordered in ED Medications  albuterol (PROVENTIL,VENTOLIN) solution continuous neb (10 mg/hr Nebulization New Bag/Given 02/13/19 1638)  methylPREDNISolone sodium succinate (SOLU-MEDROL) 125 mg/2 mL injection 62.5 mg (62.5 mg Intravenous Given 02/13/19 1655)  ipratropium (ATROVENT) nebulizer solution 0.5 mg (0.5 mg Nebulization Given 02/13/19 1638)  cefTRIAXone (ROCEPHIN) 1 g in sodium  chloride 0.9 % 100 mL IVPB (1 g Intravenous New Bag/Given 02/13/19 1657)  azithromycin (ZITHROMAX) tablet 500 mg (500 mg Oral Given 02/13/19 1656)     Initial Impression / Assessment and Plan / ED Course  I have reviewed the triage vital signs and the nursing notes.  Pertinent labs & imaging results that were available during my care of the patient were reviewed by me and considered in my medical decision making (see chart for details).  68yM with dyspnea. Pt with significant hypoxemia. He was treated with additional bronchodilators. Recommended admission. He has medical decision making capability and is declining. He willing be leaving AMA. He has already been  prescribed albuterol inhaler, abx, steroids and tamiflu. Advised to follow-up closely as outpt. Emergent return precautions discussed.   Final Clinical Impressions(s) / ED Diagnoses   Final diagnoses:  COPD exacerbation (HCC)  Community acquired pneumonia of right lung, unspecified part of lung    ED Discharge Orders    None       Raeford RazorKohut, Afiya Ferrebee, MD 02/19/19 [phone]

## 2019-02-13 NOTE — ED Notes (Signed)
Pt unwilling to stay, seen walking down hall, steady gait, nad noted at this time. Advised pt of risks of leaving AMA, pt verbalized understanding.

## 2019-02-13 NOTE — ED Triage Notes (Signed)
Sent by pcp for low o2 sats.  Pt reports cough x 1 week

## 2021-02-03 ENCOUNTER — Other Ambulatory Visit: Payer: Self-pay

## 2021-02-03 ENCOUNTER — Ambulatory Visit: Payer: Medicare Other | Admitting: Nurse Practitioner

## 2021-02-03 ENCOUNTER — Encounter: Payer: Self-pay | Admitting: Nurse Practitioner

## 2021-02-03 VITALS — BP 178/95 | HR 80 | Temp 99.2°F | Resp 22 | Ht 71.0 in | Wt 230.0 lb

## 2021-02-03 DIAGNOSIS — Z7689 Persons encountering health services in other specified circumstances: Secondary | ICD-10-CM | POA: Diagnosis not present

## 2021-02-03 DIAGNOSIS — Z139 Encounter for screening, unspecified: Secondary | ICD-10-CM | POA: Diagnosis not present

## 2021-02-03 DIAGNOSIS — H905 Unspecified sensorineural hearing loss: Secondary | ICD-10-CM | POA: Diagnosis not present

## 2021-02-03 DIAGNOSIS — J449 Chronic obstructive pulmonary disease, unspecified: Secondary | ICD-10-CM

## 2021-02-03 MED ORDER — BREZTRI AEROSPHERE 160-9-4.8 MCG/ACT IN AERO: 2.0000 | INHALATION_SPRAY | Freq: Two times a day (BID) | RESPIRATORY_TRACT | 3 refills | Status: DC

## 2021-02-03 NOTE — Patient Instructions (Addendum)
Please get fasting labs 2-3 days prior to your next appointment.  I sent in breztri for your COPD. This is a combination medicine that has 3 classes of medication to help you breath better. Use 2 puffs twice per day.

## 2021-02-03 NOTE — Assessment & Plan Note (Signed)
-  will screen for HCV with next set of labs 

## 2021-02-03 NOTE — Progress Notes (Signed)
New Patient Office Visit  Subjective:  Patient ID: Jose Villa, male    DOB: [birth year]  Age: 70 y.o. MRN: [id number]  CC:  Chief Complaint  Patient presents with  . New Patient (Initial Visit)    HPI Jose Villa presents for new patient visit. Transferring care from Dr. Marcell Anger at [facility] in [place]. Last labs were drawn about a year ago (in [place] last were 02/13/19). Last physical was about a year ago. He states he had CT scan last year and was negative for lung cancer.  He states he sleeps better in a chair than in his bed.  Past Medical History:  Diagnosis Date  . Closed fracture of lateral malleolus 12/02/2010   Qualifier: Diagnosis of  By: Aline Brochure MD, Dorothyann Peng    . COPD (chronic obstructive pulmonary disease) ([place])   . Hearing loss, central     Past Surgical History:  Procedure Laterality Date  . FRACTURE SURGERY     right ankle    Family History  Problem Relation Age of Onset  . Cancer Mother   . Asthma Sister     Social History   Socioeconomic History  . Marital status: Single    Spouse name: Not on file  . Number of children: Not on file  . Years of education: Not on file  . Highest education level: Not on file  Occupational History  . Occupation: retired  Tobacco Use  . Smoking status: Former Smoker    Packs/day: 1.00    Types: Cigarettes    Quit date: 12/26/2016    Years since quitting: 4.1  . Smokeless tobacco: Never Used  Vaping Use  . Vaping Use: Never used  Substance and Sexual Activity  . Alcohol use: Yes    Comment: 2 beers per day; one 12 pack per week  . Drug use: No  . Sexual activity: Not Currently  Other Topics Concern  . Not on file  Social History Narrative  . Not on file   Social Determinants of Health   Financial Resource Strain: Not on file  Food Insecurity: Not on file  Transportation Needs: Not on file  Physical Activity: Not on file  Stress: Not on file  Social Connections: Not on file  Intimate Partner  Violence: Not on file    ROS Review of Systems  Constitutional: Negative.   HENT:       Hard of hearing; wears hearing aides   Respiratory: Positive for cough, shortness of breath and wheezing.        Hx of COPD  Cardiovascular: Negative.     Objective:   Today's Vitals: BP (!) 178/95   Pulse 80   Temp 99.2 F (37.3 C)   Resp (!) 22   Ht _0  (1.803 m)   Wt 230 lb (104.3 kg)   SpO2 (!) 86%   BMI 32.08 kg/m   Physical Exam Constitutional:      Appearance: He is obese.  Cardiovascular:     Rate and Rhythm: Normal rate and regular rhythm.     Pulses: Normal pulses.     Heart sounds: Normal heart sounds.  Pulmonary:     Effort: Pulmonary effort is normal.     Breath sounds: Wheezing present.     Comments: Diminished lower lung fields Neurological:     Mental Status: He is alert.  Psychiatric:        Mood and Affect: Mood normal.        Behavior: Behavior normal.  Thought Content: Thought content normal.        Judgment: Judgment normal.     Assessment & Plan:   Problem List Items Addressed This Visit      Respiratory   COPD (chronic obstructive pulmonary disease) ([place])    -used advair previously -he desaturates down to 85% after ambulating -he uses albuterol occasionally and has his inhaler with him -Rx. breztri -we discussed home oxygen therapy, but he refuses today      Relevant Medications   Budeson-Glycopyrrol-Formoterol (BREZTRI AEROSPHERE) 160-9-4.8 MCG/ACT AERO     Nervous and Auditory   LOSS, CENTRAL HEARING    -has hearing aides        Other   Encounter to establish care - Primary    -obtain medical records      Relevant Orders   CBC with Differential/Platelet   CMP14+EGFR   Lipid Panel With LDL/HDL Ratio   Screening due    -will screen for HCV with next set of labs      Relevant Orders   HCV Ab w/Rflx to Verification      Outpatient Encounter Medications as of 02/03/2021  Medication Sig  .  Budeson-Glycopyrrol-Formoterol (BREZTRI AEROSPHERE) 160-9-4.8 MCG/ACT AERO Inhale 2 puffs into the lungs in the morning and at bedtime.  . cholecalciferol (VITAMIN D3) 25 MCG (1000 UT) tablet Take 1,000 Units by mouth daily.  . Omega-3 Fatty Acids (FISH OIL) 1000 MG CAPS Take 1 capsule by mouth daily.  Marland Kitchen albuterol (VENTOLIN HFA) 108 (90 BASE) MCG/ACT inhaler Inhale 2 puffs into the lungs every 4 (four) hours as needed for wheezing.   No facility-administered encounter medications on file as of 02/03/2021.    Follow-up: Return in about 2 weeks (around 02/17/2021) for Physical Exam.   Noreene Larsson, NP

## 2021-02-03 NOTE — Assessment & Plan Note (Signed)
-  obtain medical records 

## 2021-02-03 NOTE — Assessment & Plan Note (Addendum)
-  used advair previously -he desaturates down to 85% after ambulating -he uses albuterol occasionally and has his inhaler with him -Rx. breztri -we discussed home oxygen therapy, but he refuses today

## 2021-02-03 NOTE — Assessment & Plan Note (Signed)
-  has hearing aides

## 2021-02-13 LAB — CMP14+EGFR
ALT: 39 IU/L (ref 0–44)
AST: 52 IU/L — ABNORMAL HIGH (ref 0–40)
Albumin/Globulin Ratio: 1.6 (ref 1.2–2.2)
Albumin: 4.4 g/dL (ref 3.8–4.8)
Alkaline Phosphatase: 60 IU/L (ref 44–121)
BUN/Creatinine Ratio: 13 (ref 10–24)
BUN: 12 mg/dL (ref 8–27)
Bilirubin Total: 0.6 mg/dL (ref 0.0–1.2)
CO2: 23 mmol/L (ref 20–29)
Calcium: 9.6 mg/dL (ref 8.6–10.2)
Chloride: 100 mmol/L (ref 96–106)
Creatinine, Ser: 0.9 mg/dL (ref 0.76–1.27)
GFR calc Af Amer: 101 mL/min/{1.73_m2} (ref >59)
GFR calc non Af Amer: 87 mL/min/{1.73_m2} (ref >59)
Globulin, Total: 2.7 g/dL (ref 1.5–4.5)
Glucose: 94 mg/dL (ref 65–99)
Potassium: 4.6 mmol/L (ref 3.5–5.2)
Sodium: 140 mmol/L (ref 134–144)
Total Protein: 7.1 g/dL (ref 6.0–8.5)

## 2021-02-13 LAB — CBC WITH DIFFERENTIAL/PLATELET
Basophils Absolute: 0.1 10*3/uL (ref 0.0–0.2)
Basos: 1 %
EOS (ABSOLUTE): 0.4 10*3/uL (ref 0.0–0.4)
Eos: 5 %
Hematocrit: 50.6 % (ref 37.5–51.0)
Hemoglobin: 17 g/dL (ref 13.0–17.7)
Immature Grans (Abs): 0.1 10*3/uL (ref 0.0–0.1)
Immature Granulocytes: 1 %
Lymphocytes Absolute: 2.1 10*3/uL (ref 0.7–3.1)
Lymphs: 30 %
MCH: 31.9 pg (ref 26.6–33.0)
MCHC: 33.6 g/dL (ref 31.5–35.7)
MCV: 95 fL (ref 79–97)
Monocytes Absolute: 0.6 10*3/uL (ref 0.1–0.9)
Monocytes: 9 %
Neutrophils Absolute: 3.8 10*3/uL (ref 1.4–7.0)
Neutrophils: 54 %
Platelets: 210 10*3/uL (ref 150–450)
RBC: 5.33 x10E6/uL (ref 4.14–5.80)
RDW: 12.8 % (ref 11.6–15.4)
WBC: 7.1 10*3/uL (ref 3.4–10.8)

## 2021-02-13 LAB — LIPID PANEL WITH LDL/HDL RATIO
Cholesterol, Total: 197 mg/dL (ref 100–199)
HDL: 40 mg/dL (ref >39)
LDL Chol Calc (NIH): 139 mg/dL — ABNORMAL HIGH (ref 0–99)
LDL/HDL Ratio: 3.5 ratio (ref 0.0–3.6)
Triglycerides: 101 mg/dL (ref 0–149)
VLDL Cholesterol Cal: 18 mg/dL (ref 5–40)

## 2021-02-13 LAB — HCV INTERPRETATION

## 2021-02-13 LAB — HCV AB W/RFLX TO VERIFICATION: HCV Ab: <0.1 s/co ratio (ref 0.0–0.9)

## 2021-02-15 NOTE — Progress Notes (Signed)
He has appt. In 2 days, and we will discuss results then.

## 2021-02-15 NOTE — Progress Notes (Signed)
LDL, or bad cholesterol, is elevated and AST, a liver enzyme, is elevated as well. I'm not concerned about the AST at the moment as it is only slightly elevated, but we will monitor it with routine labs.

## 2021-02-17 ENCOUNTER — Ambulatory Visit (INDEPENDENT_AMBULATORY_CARE_PROVIDER_SITE_OTHER): Payer: Medicare Other | Admitting: Nurse Practitioner

## 2021-02-17 ENCOUNTER — Encounter: Payer: Self-pay | Admitting: Nurse Practitioner

## 2021-02-17 ENCOUNTER — Other Ambulatory Visit: Payer: Self-pay

## 2021-02-17 DIAGNOSIS — I10 Essential (primary) hypertension: Secondary | ICD-10-CM

## 2021-02-17 DIAGNOSIS — E785 Hyperlipidemia, unspecified: Secondary | ICD-10-CM | POA: Insufficient documentation

## 2021-02-17 DIAGNOSIS — Z0001 Encounter for general adult medical examination with abnormal findings: Secondary | ICD-10-CM

## 2021-02-17 DIAGNOSIS — E7800 Pure hypercholesterolemia, unspecified: Secondary | ICD-10-CM

## 2021-02-17 DIAGNOSIS — J449 Chronic obstructive pulmonary disease, unspecified: Secondary | ICD-10-CM

## 2021-02-17 MED ORDER — ATORVASTATIN CALCIUM 40 MG PO TABS: 40.0000 mg | ORAL_TABLET | Freq: Every day | ORAL | 3 refills | Status: DC

## 2021-02-17 MED ORDER — LISINOPRIL 20 MG PO TABS: 20.0000 mg | ORAL_TABLET | Freq: Every day | ORAL | 3 refills | Status: DC

## 2021-02-17 MED ORDER — OLMESARTAN MEDOXOMIL 20 MG PO TABS: 20.0000 mg | ORAL_TABLET | Freq: Every day | ORAL | 1 refills | Status: DC

## 2021-02-17 NOTE — Progress Notes (Signed)
Established Patient Office Visit  Subjective:  Patient ID: Jose Villa, male    DOB: 11/07/1951  Age: 70 y.o. MRN: [id number]  CC:  Chief Complaint  Patient presents with  . Annual Exam    HPI Jose Villa presents for physical exam. No acute concerns.  Past Medical History:  Diagnosis Date  . Closed fracture of lateral malleolus 12/02/2010   Qualifier: Diagnosis of  By: Romeo Apple MD, Duffy Rhody    . COPD (chronic obstructive pulmonary disease) ([facility])   . Hearing loss, central     Past Surgical History:  Procedure Laterality Date  . FRACTURE SURGERY     right ankle    Family History  Problem Relation Age of Onset  . Cancer Mother   . Asthma Sister     Social History   Socioeconomic History  . Marital status: Single    Spouse name: Not on file  . Number of children: Not on file  . Years of education: Not on file  . Highest education level: Not on file  Occupational History  . Occupation: retired  Tobacco Use  . Smoking status: Former Smoker    Packs/day: 1.00    Types: Cigarettes    Quit date: 12/26/2016    Years since quitting: 4.1  . Smokeless tobacco: Never Used  Vaping Use  . Vaping Use: Never used  Substance and Sexual Activity  . Alcohol use: Yes    Comment: 2 beers per day; one 12 pack per week  . Drug use: No  . Sexual activity: Not Currently  Other Topics Concern  . Not on file  Social History Narrative  . Not on file   Social Determinants of Health   Financial Resource Strain: Not on file  Food Insecurity: Not on file  Transportation Needs: Not on file  Physical Activity: Not on file  Stress: Not on file  Social Connections: Not on file  Intimate Partner Violence: Not on file    Outpatient Medications Prior to Visit  Medication Sig Dispense Refill  . Budeson-Glycopyrrol-Formoterol (BREZTRI AEROSPHERE) 160-9-4.8 MCG/ACT AERO Inhale 2 puffs into the lungs in the morning and at bedtime. 10.7 g 3  . cholecalciferol (VITAMIN D3) 25 MCG (1000  UT) tablet Take 1,000 Units by mouth daily.    . Omega-3 Fatty Acids (FISH OIL) 1000 MG CAPS Take 1 capsule by mouth daily.    Marland Kitchen albuterol (VENTOLIN HFA) 108 (90 BASE) MCG/ACT inhaler Inhale 2 puffs into the lungs every 4 (four) hours as needed for wheezing. 1 Inhaler 3   No facility-administered medications prior to visit.    Allergies  Allergen Reactions  . Coconut Oil Fatty Acid Diethanolamide [Cocamide Dea] Hives    ROS Review of Systems  Constitutional: Negative.   HENT: Negative.   Eyes: Negative.   Respiratory: Positive for shortness of breath.        With exertion  Cardiovascular: Negative.   Gastrointestinal: Negative.   Endocrine: Negative.   Genitourinary: Negative.   Musculoskeletal: Negative.   Skin: Negative.   Allergic/Immunologic: Negative.   Neurological: Negative.   Hematological: Negative.   Psychiatric/Behavioral: Negative.       Objective:    Physical Exam Constitutional:      Appearance: Normal appearance.  HENT:     Head: Normocephalic and atraumatic.     Right Ear: Tympanic membrane, ear canal and external ear normal.     Left Ear: Tympanic membrane, ear canal and external ear normal.     Nose: Nose normal.  Mouth/Throat:     Mouth: Mucous membranes are moist.     Pharynx: Oropharynx is clear.  Eyes:     Extraocular Movements: Extraocular movements intact.     Conjunctiva/sclera: Conjunctivae normal.     Pupils: Pupils are equal, round, and reactive to light.  Cardiovascular:     Rate and Rhythm: Normal rate and regular rhythm.     Pulses: Normal pulses.     Heart sounds: Normal heart sounds.  Pulmonary:     Effort: No respiratory distress.     Breath sounds: No stridor. Wheezing present. No rhonchi or rales.     Comments: Increased work of breathing with exertion; O2 91% at rest, 86% after walking Chest:     Chest wall: No tenderness.  Abdominal:     General: Abdomen is flat. Bowel sounds are normal.     Palpations: Abdomen is  soft.  Musculoskeletal:        General: Normal range of motion.     Cervical back: Normal range of motion and neck supple.  Skin:    General: Skin is warm and dry.     Capillary Refill: Capillary refill takes less than 2 seconds.  Neurological:     General: No focal deficit present.     Mental Status: He is alert and oriented to person, place, and time.  Psychiatric:        Mood and Affect: Mood normal.        Behavior: Behavior normal.        Thought Content: Thought content normal.        Judgment: Judgment normal.     BP (!) 170/91   Pulse 76   Temp 98.6 F (37 C)   Resp 20   Ht 5\' 11"  (1.803 m)   Wt 229 lb (103.9 kg)   SpO2 (!) 86%   BMI 31.94 kg/m  Wt Readings from Last 3 Encounters:  02/17/21 229 lb (103.9 kg)  02/03/21 230 lb (104.3 kg)  02/13/19 212 lb (96.2 kg)     Health Maintenance Due  Topic Date Due  . PNA vac Low Risk Adult (1 of 2 - PCV13) Never done  . COVID-19 Vaccine (3 - Booster for Moderna series) 09/25/2020    There are no preventive care reminders to display for this patient.  Lab Results  Component Value Date   TSH 3.357 01/27/2007   Lab Results  Component Value Date   WBC 7.1 02/12/2021   HGB 17.0 02/12/2021   HCT 50.6 02/12/2021   MCV 95 02/12/2021   PLT 210 02/12/2021   Lab Results  Component Value Date   NA 140 02/12/2021   K 4.6 02/12/2021   CO2 23 02/12/2021   GLUCOSE 94 02/12/2021   BUN 12 02/12/2021   CREATININE 0.90 02/12/2021   BILITOT 0.6 02/12/2021   ALKPHOS 60 02/12/2021   AST 52 (H) 02/12/2021   ALT 39 02/12/2021   PROT 7.1 02/12/2021   ALBUMIN 4.4 02/12/2021   CALCIUM 9.6 02/12/2021   ANIONGAP 11 02/13/2019   Lab Results  Component Value Date   CHOL 197 02/12/2021   Lab Results  Component Value Date   HDL 40 02/12/2021   Lab Results  Component Value Date   LDLCALC 139 (H) 02/12/2021   Lab Results  Component Value Date   TRIG 101 02/12/2021   Lab Results  Component Value Date   CHOLHDL 4.7  12/15/2011   No results found for: HGBA1C    Assessment &  Plan:   Problem List Items Addressed This Visit      Cardiovascular and Mediastinum   Hypertension, essential    -BP 170/91 today -Rx. olmesartan      Relevant Medications   olmesartan (BENICAR) 20 MG tablet   atorvastatin (LIPITOR) 40 MG tablet     Respiratory   COPD (chronic obstructive pulmonary disease) ([facility])    -he states he is breathing and sleeping better after starting Breztri -his O2 dropped to 86% after walking to the room -we discussed that he would likely benefit from home O2, but he adamantly refuses today -continue Breztri        Other   Hyperlipidemia    Lab Results  Component Value Date   CHOL 197 02/12/2021   HDL 40 02/12/2021   LDLCALC 139 (H) 02/12/2021   TRIG 101 02/12/2021   CHOLHDL 4.7 12/15/2011  -Rx. atorvastatin       Relevant Medications   olmesartan (BENICAR) 20 MG tablet   atorvastatin (LIPITOR) 40 MG tablet      Meds ordered this encounter  Medications  . olmesartan (BENICAR) 20 MG tablet    Sig: Take 1 tablet (20 mg total) by mouth daily.    Dispense:  90 tablet    Refill:  1  . atorvastatin (LIPITOR) 40 MG tablet    Sig: Take 1 tablet (40 mg total) by mouth daily.    Dispense:  90 tablet    Refill:  3    Follow-up: Return in about 1 month (around 03/17/2021) for BP check and O2 check.    Heather Roberts, NP

## 2021-02-17 NOTE — Assessment & Plan Note (Signed)
Lab Results  Component Value Date   CHOL 197 02/12/2021   HDL 40 02/12/2021   LDLCALC 139 (H) 02/12/2021   TRIG 101 02/12/2021   CHOLHDL 4.7 12/15/2011  -Rx. atorvastatin

## 2021-02-17 NOTE — Addendum Note (Signed)
Addended by: Bjorn Pippin on: 02/17/2021 03:43 PM   Modules accepted: Orders

## 2021-02-17 NOTE — Assessment & Plan Note (Signed)
-  he states he is breathing and sleeping better after starting Breztri -his O2 dropped to 86% after walking to the room -we discussed that he would likely benefit from home O2, but he adamantly refuses today -continue [employer]

## 2021-02-17 NOTE — Assessment & Plan Note (Addendum)
-  BP 170/91 today -Originally sent in olmesartan, but he states this is over $100 -sent Rx. Lisinopril to [facility]

## 2021-03-17 ENCOUNTER — Other Ambulatory Visit: Payer: Self-pay

## 2021-03-17 ENCOUNTER — Encounter: Payer: Self-pay | Admitting: Nurse Practitioner

## 2021-03-17 ENCOUNTER — Ambulatory Visit: Payer: Medicare Other | Admitting: Nurse Practitioner

## 2021-03-17 DIAGNOSIS — J449 Chronic obstructive pulmonary disease, unspecified: Secondary | ICD-10-CM | POA: Diagnosis not present

## 2021-03-17 DIAGNOSIS — I10 Essential (primary) hypertension: Secondary | ICD-10-CM | POA: Diagnosis not present

## 2021-03-17 MED ORDER — ALBUTEROL SULFATE HFA 108 (90 BASE) MCG/ACT IN AERS: 2.0000 | INHALATION_SPRAY | RESPIRATORY_TRACT | 3 refills | Status: DC | PRN

## 2021-03-17 NOTE — Assessment & Plan Note (Signed)
-  O2 sat 86%; he states his home O2 sat is 90% consistently -we discussed oxygen saturation and oxygen reserves and that he will decompensate quickly if he gets sick and would benefit from home O2 if only for exacerbations, but he still refuses today -refilled albuterol -continue breztri

## 2021-03-17 NOTE — Assessment & Plan Note (Addendum)
BP Readings from Last 3 Encounters:  03/17/21 (!) 169/105  02/17/21 (!) 170/91  02/03/21 (!) 178/95   -BP still elevated today -Rx. Amlodipine in addition to his losartan -if still elevated with next check, will consider home sleep study

## 2021-03-17 NOTE — Progress Notes (Signed)
Acute Office Visit  Subjective:    Patient ID: Jose Villa, male    DOB: 03-17-1951, 70 y.o.   MRN: [id number]  Chief Complaint  Patient presents with  . Hypertension  . Shortness of Breath    Oxygen continues to be low after exertion.     HPI Patient is in today for follow-up for HTN and COPD.  At last OV on 02/17/21, his BP was 170/91, so he was prescribed lisinopril.  He was originally prescribed olmesartan, but he states this was too expensive with his insurance. Today, BP is still elevated at 169/105.  At his last OV, his O2 dropped to 86% with ambulation. He has been using Breztri, and states this helped his breathing improve.  He refused oxygen at his last OV despite the obvious need for it.  Past Medical History:  Diagnosis Date  . Closed fracture of lateral malleolus 12/02/2010   Qualifier: Diagnosis of  By: Romeo Apple MD, Duffy Rhody    . COPD (chronic obstructive pulmonary disease) ([facility])   . Hearing loss, central     Past Surgical History:  Procedure Laterality Date  . FRACTURE SURGERY     right ankle    Family History  Problem Relation Age of Onset  . Cancer Mother   . Asthma Sister     Social History   Socioeconomic History  . Marital status: Single    Spouse name: Not on file  . Number of children: Not on file  . Years of education: Not on file  . Highest education level: Not on file  Occupational History  . Occupation: retired  Tobacco Use  . Smoking status: Former Smoker    Packs/day: 1.00    Types: Cigarettes    Quit date: 12/26/2016    Years since quitting: 4.2  . Smokeless tobacco: Never Used  Vaping Use  . Vaping Use: Never used  Substance and Sexual Activity  . Alcohol use: Yes    Comment: 2 beers per day; one 12 pack per week  . Drug use: No  . Sexual activity: Not Currently  Other Topics Concern  . Not on file  Social History Narrative  . Not on file   Social Determinants of Health   Financial Resource Strain: Not on file  Food  Insecurity: Not on file  Transportation Needs: Not on file  Physical Activity: Not on file  Stress: Not on file  Social Connections: Not on file  Intimate Partner Violence: Not on file    Outpatient Medications Prior to Visit  Medication Sig Dispense Refill  . atorvastatin (LIPITOR) 40 MG tablet Take 1 tablet (40 mg total) by mouth daily. 90 tablet 3  . Budeson-Glycopyrrol-Formoterol (BREZTRI AEROSPHERE) 160-9-4.8 MCG/ACT AERO Inhale 2 puffs into the lungs in the morning and at bedtime. 10.7 g 3  . cholecalciferol (VITAMIN D3) 25 MCG (1000 UT) tablet Take 1,000 Units by mouth daily.    Marland Kitchen lisinopril (ZESTRIL) 20 MG tablet Take 1 tablet (20 mg total) by mouth daily. 90 tablet 3  . Omega-3 Fatty Acids (FISH OIL) 1000 MG CAPS Take 1 capsule by mouth daily.    Marland Kitchen albuterol (VENTOLIN HFA) 108 (90 BASE) MCG/ACT inhaler Inhale 2 puffs into the lungs every 4 (four) hours as needed for wheezing. 1 Inhaler 3   No facility-administered medications prior to visit.    Allergies  Allergen Reactions  . Coconut Oil Fatty Acid Diethanolamide [Cocamide Dea] Hives    Review of Systems  Constitutional: Negative.  Respiratory:       States breathing is better with breztri  Cardiovascular: Negative.   Musculoskeletal: Negative.   Psychiatric/Behavioral: Negative.        Objective:    Physical Exam Constitutional:      Appearance: He is well-developed.  Cardiovascular:     Rate and Rhythm: Normal rate and regular rhythm.  Pulmonary:     Effort: Pulmonary effort is normal.     Breath sounds: Decreased breath sounds present. No wheezing, rhonchi or rales.  Neurological:     Mental Status: He is alert.  Psychiatric:        Mood and Affect: Mood normal.        Behavior: Behavior normal.     BP (!) 169/105   Pulse 72   Temp 98.2 F (36.8 C)   Resp 20   Ht 5\' 11"  (1.803 m)   Wt 236 lb (107 kg)   SpO2 (!) 86%   BMI 32.92 kg/m  Wt Readings from Last 3 Encounters:  03/17/21 236 lb (107  kg)  02/17/21 229 lb (103.9 kg)  02/03/21 230 lb (104.3 kg)    Health Maintenance Due  Topic Date Due  . PNA vac Low Risk Adult (1 of 2 - PCV13) Never done  . COVID-19 Vaccine (3 - Booster for Moderna series) 09/25/2020    There are no preventive care reminders to display for this patient.   Lab Results  Component Value Date   TSH 3.357 01/27/2007   Lab Results  Component Value Date   WBC 7.1 02/12/2021   HGB 17.0 02/12/2021   HCT 50.6 02/12/2021   MCV 95 02/12/2021   PLT 210 02/12/2021   Lab Results  Component Value Date   NA 140 02/12/2021   K 4.6 02/12/2021   CO2 23 02/12/2021   GLUCOSE 94 02/12/2021   BUN 12 02/12/2021   CREATININE 0.90 02/12/2021   BILITOT 0.6 02/12/2021   ALKPHOS 60 02/12/2021   AST 52 (H) 02/12/2021   ALT 39 02/12/2021   PROT 7.1 02/12/2021   ALBUMIN 4.4 02/12/2021   CALCIUM 9.6 02/12/2021   ANIONGAP 11 02/13/2019   Lab Results  Component Value Date   CHOL 197 02/12/2021   Lab Results  Component Value Date   HDL 40 02/12/2021   Lab Results  Component Value Date   LDLCALC 139 (H) 02/12/2021   Lab Results  Component Value Date   TRIG 101 02/12/2021   Lab Results  Component Value Date   CHOLHDL 4.7 12/15/2011   No results found for: HGBA1C     Assessment & Plan:   Problem List Items Addressed This Visit      Cardiovascular and Mediastinum   Hypertension, essential    BP Readings from Last 3 Encounters:  03/17/21 (!) 169/105  02/17/21 (!) 170/91  02/03/21 (!) 178/95   -BP still elevated today -Rx. Amlodipine in addition to his losartan -if still elevated with next check, will consider home sleep study        Respiratory   COPD (chronic obstructive pulmonary disease) ([facility])    -O2 sat 86%; he states his home O2 sat is 90% consistently -we discussed oxygen saturation and oxygen reserves and that he will decompensate quickly if he gets sick and would benefit from home O2 if only for exacerbations, but he still  refuses today -refilled albuterol -continue breztri      Relevant Medications   albuterol (VENTOLIN HFA) 108 (90 Base) MCG/ACT inhaler  Meds ordered this encounter  Medications  . albuterol (VENTOLIN HFA) 108 (90 Base) MCG/ACT inhaler    Sig: Inhale 2 puffs into the lungs every 4 (four) hours as needed for wheezing.    Dispense:  1 each    Refill:  3     Heather Roberts, NP

## 2021-04-21 ENCOUNTER — Other Ambulatory Visit: Payer: Self-pay

## 2021-04-21 ENCOUNTER — Encounter: Payer: Self-pay | Admitting: Nurse Practitioner

## 2021-04-21 ENCOUNTER — Ambulatory Visit: Payer: Medicare Other | Admitting: Nurse Practitioner

## 2021-04-21 VITALS — BP 143/73 | HR 83 | Temp 98.3°F | Resp 20 | Ht 71.0 in | Wt 235.0 lb

## 2021-04-21 DIAGNOSIS — I10 Essential (primary) hypertension: Secondary | ICD-10-CM | POA: Diagnosis not present

## 2021-04-21 DIAGNOSIS — J449 Chronic obstructive pulmonary disease, unspecified: Secondary | ICD-10-CM

## 2021-04-21 DIAGNOSIS — E7800 Pure hypercholesterolemia, unspecified: Secondary | ICD-10-CM | POA: Diagnosis not present

## 2021-04-21 MED ORDER — AMLODIPINE BESYLATE 5 MG PO TABS
5.0000 mg | ORAL_TABLET | Freq: Every day | ORAL | 3 refills | Status: DC
Start: 2021-04-21 — End: 2022-01-03

## 2021-04-21 NOTE — Patient Instructions (Addendum)
Please have fasting labs drawn 2-3 days prior to your appointment so we can discuss the results during your office visit.  Please bring in all medicines that you are currently taking at your next visit so we can make sure our medicine list matches what you are taking.

## 2021-04-21 NOTE — Progress Notes (Signed)
Acute Office Visit  Subjective:    Patient ID: Jose Villa, male    DOB: Jan 18, 1951, 70 y.o.   MRN: [id number]  Chief Complaint  Patient presents with  . Hypertension  . COPD    HPI Patient is in today for follow-up for HTN and COPD. At his last OV, we added amlodipine to his losartan. At that time, his BP was 169/105.  He states that he didn get the amlodipine, and he isn't sure what medicine he is taking for his BP.  He also has COPD, and his O2 drops into the mid 80s frequently. He has refused oxygen therapy several times.  He is taking breztri for maintenance therapy, and that has been helping.  Past Medical History:  Diagnosis Date  . Closed fracture of lateral malleolus 12/02/2010   Qualifier: Diagnosis of  By: Romeo Apple MD, Duffy Rhody    . COPD (chronic obstructive pulmonary disease) (HCC)   . Hearing loss, central     Past Surgical History:  Procedure Laterality Date  . FRACTURE SURGERY     right ankle    Family History  Problem Relation Age of Onset  . Cancer Mother   . Asthma Sister     Social History   Socioeconomic History  . Marital status: Single    Spouse name: Not on file  . Number of children: Not on file  . Years of education: Not on file  . Highest education level: Not on file  Occupational History  . Occupation: retired  Tobacco Use  . Smoking status: Former Smoker    Packs/day: 1.00    Types: Cigarettes    Quit date: 12/26/2016    Years since quitting: 4.3  . Smokeless tobacco: Never Used  Vaping Use  . Vaping Use: Never used  Substance and Sexual Activity  . Alcohol use: Yes    Comment: 2 beers per day; one 12 pack per week  . Drug use: No  . Sexual activity: Not Currently  Other Topics Concern  . Not on file  Social History Narrative  . Not on file   Social Determinants of Health   Financial Resource Strain: Not on file  Food Insecurity: Not on file  Transportation Needs: Not on file  Physical Activity: Not on file  Stress:  Not on file  Social Connections: Not on file  Intimate Partner Violence: Not on file    Outpatient Medications Prior to Visit  Medication Sig Dispense Refill  . albuterol (VENTOLIN HFA) 108 (90 Base) MCG/ACT inhaler Inhale 2 puffs into the lungs every 4 (four) hours as needed for wheezing. 1 each 3  . atorvastatin (LIPITOR) 40 MG tablet Take 1 tablet (40 mg total) by mouth daily. 90 tablet 3  . Budeson-Glycopyrrol-Formoterol (BREZTRI AEROSPHERE) 160-9-4.8 MCG/ACT AERO Inhale 2 puffs into the lungs in the morning and at bedtime. 10.7 g 3  . cholecalciferol (VITAMIN D3) 25 MCG (1000 UT) tablet Take 1,000 Units by mouth daily.    Marland Kitchen lisinopril (ZESTRIL) 20 MG tablet Take 1 tablet (20 mg total) by mouth daily. 90 tablet 3  . Omega-3 Fatty Acids (FISH OIL) 1000 MG CAPS Take 1 capsule by mouth daily.     No facility-administered medications prior to visit.    Allergies  Allergen Reactions  . Coconut Oil Fatty Acid Diethanolamide [Cocamide Dea] Hives    Review of Systems  Constitutional: Negative.   Respiratory: Positive for chest tightness and shortness of breath. Negative for cough and wheezing.   Cardiovascular:  Negative.   Psychiatric/Behavioral: Negative.        Objective:    Physical Exam Constitutional:      Appearance: Normal appearance.  Cardiovascular:     Rate and Rhythm: Normal rate and regular rhythm.     Pulses: Normal pulses.     Heart sounds: Normal heart sounds.  Pulmonary:     Comments: Decreased breath sounds Neurological:     Mental Status: He is alert.  Psychiatric:        Mood and Affect: Mood normal.        Behavior: Behavior normal.        Thought Content: Thought content normal.        Judgment: Judgment normal.     BP (!) 143/73   Pulse 83   Temp 98.3 F (36.8 C)   Resp 20   Ht $R'5\' 11"'CF$  (1.803 m)   Wt 235 lb (106.6 kg)   SpO2 (!) 87%   BMI 32.78 kg/m  Wt Readings from Last 3 Encounters:  04/21/21 235 lb (106.6 kg)  03/17/21 236 lb (107  kg)  02/17/21 229 lb (103.9 kg)    Health Maintenance Due  Topic Date Due  . PNA vac Low Risk Adult (1 of 2 - PCV13) Never done  . COVID-19 Vaccine (3 - Booster for Moderna series) 09/25/2020    There are no preventive care reminders to display for this patient.   Lab Results  Component Value Date   TSH 3.357 01/27/2007   Lab Results  Component Value Date   WBC 7.1 02/12/2021   HGB 17.0 02/12/2021   HCT 50.6 02/12/2021   MCV 95 02/12/2021   PLT 210 02/12/2021   Lab Results  Component Value Date   NA 140 02/12/2021   K 4.6 02/12/2021   CO2 23 02/12/2021   GLUCOSE 94 02/12/2021   BUN 12 02/12/2021   CREATININE 0.90 02/12/2021   BILITOT 0.6 02/12/2021   ALKPHOS 60 02/12/2021   AST 52 (H) 02/12/2021   ALT 39 02/12/2021   PROT 7.1 02/12/2021   ALBUMIN 4.4 02/12/2021   CALCIUM 9.6 02/12/2021   ANIONGAP 11 02/13/2019   Lab Results  Component Value Date   CHOL 197 02/12/2021   Lab Results  Component Value Date   HDL 40 02/12/2021   Lab Results  Component Value Date   LDLCALC 139 (H) 02/12/2021   Lab Results  Component Value Date   TRIG 101 02/12/2021   Lab Results  Component Value Date   CHOLHDL 4.7 12/15/2011   No results found for: HGBA1C     Assessment & Plan:   Problem List Items Addressed This Visit      Cardiovascular and Mediastinum   Hypertension, essential    BP Readings from Last 3 Encounters:  04/21/21 (!) 143/73  03/17/21 (!) 169/105  02/17/21 (!) 170/91   -his BP has greatly improved since his last OV -he states he didn't get the amlodipine rx; resent this today -last note states that he was taking losartan, but med list today says he is taking lisinopril; he doesn't know what he is taking -bring in all meds that he is taking for reconciliation at his next OV      Relevant Medications   amLODipine (NORVASC) 5 MG tablet   Other Relevant Orders   CBC with Differential/Platelet   CMP14+EGFR   Lipid Panel With LDL/HDL Ratio      Respiratory   COPD (chronic obstructive pulmonary disease) ([place])    -  O2 sats still in 80s today -he is in no distress and refuses O2 therapy        Other   Hyperlipidemia - Primary   Relevant Medications   amLODipine (NORVASC) 5 MG tablet   Other Relevant Orders   Lipid Panel With LDL/HDL Ratio       Meds ordered this encounter  Medications  . amLODipine (NORVASC) 5 MG tablet    Sig: Take 1 tablet (5 mg total) by mouth daily.    Dispense:  90 tablet    Refill:  [place], NP

## 2021-04-21 NOTE — Assessment & Plan Note (Signed)
-  O2 sats still in 80s today -he is in no distress and refuses O2 therapy

## 2021-04-21 NOTE — Assessment & Plan Note (Signed)
BP Readings from Last 3 Encounters:  04/21/21 (!) 143/73  03/17/21 (!) 169/105  02/17/21 (!) 170/91   -his BP has greatly improved since his last OV -he states he didn't get the amlodipine rx; resent this today -last note states that he was taking losartan, but med list today says he is taking lisinopril; he doesn't know what he is taking -bring in all meds that he is taking for reconciliation at his next OV

## 2021-05-28 ENCOUNTER — Telehealth: Payer: Self-pay

## 2021-05-28 NOTE — Telephone Encounter (Signed)
Emailed Maria in [employer] to refill claim.      Alvira Philips,  I received notification from [facility] yesterday that they have updated Casimiro Needle to a PCP.  Thanks, Amy Kathleen [facility] Payor Enrollment Specialist  Email:  [email or web address] Phone: [phone]  Fax: [phone]    From: [email or web address]  Sent: Monday, Apr 26, 2021 1:49 PM To: Earleen [email or web address]> Subject: "secure" BCBS? Geralynn Rile NP   Patient Azion Centrella MRN [id number] is being charged a spec instead of PCP.  Casimiro Needle is a PCP.  Can you check on this.   Earlie Lou Primary Care Administrator [phone]

## 2021-08-06 ENCOUNTER — Other Ambulatory Visit: Payer: Self-pay

## 2021-08-06 ENCOUNTER — Ambulatory Visit (INDEPENDENT_AMBULATORY_CARE_PROVIDER_SITE_OTHER): Payer: Medicare Other | Admitting: *Deleted

## 2021-08-06 ENCOUNTER — Other Ambulatory Visit: Payer: Self-pay | Admitting: *Deleted

## 2021-08-06 DIAGNOSIS — Z0000 Encounter for general adult medical examination without abnormal findings: Secondary | ICD-10-CM | POA: Diagnosis not present

## 2021-08-06 MED ORDER — ALBUTEROL SULFATE HFA 108 (90 BASE) MCG/ACT IN AERS: 2.0000 | INHALATION_SPRAY | RESPIRATORY_TRACT | 3 refills | Status: DC | PRN

## 2021-08-06 NOTE — Patient Instructions (Signed)
[name] , Thank you for taking time to come for your Medicare Wellness Visit. I appreciate your ongoing commitment to your health goals. Please review the following plan we discussed and let me know if I can assist you in the future.   Screening recommendations/referrals: Colonoscopy: Patient declined referral  Recommended yearly ophthalmology/optometry visit for glaucoma screening and checkup Recommended yearly dental visit for hygiene and checkup  Vaccinations: Influenza vaccine: Due now  Pneumococcal vaccine: Due now Tdap vaccine: Due now  Shingles vaccine: Due now     Advanced directives: Information provided   Conditions/risks identified: Hypertension  Next appointment: 1 year   Preventive Care 59 Years and Older, Male Preventive care refers to lifestyle choices and visits with your health care provider that can promote health and wellness. What does preventive care include? A yearly physical exam. This is also called an annual well check. Dental exams once or twice a year. Routine eye exams. Ask your health care provider how often you should have your eyes checked. Personal lifestyle choices, including: Daily care of your teeth and gums. Regular physical activity. Eating a healthy diet. Avoiding tobacco and drug use. Limiting alcohol use. Practicing safe sex. Taking low doses of aspirin every day. Taking vitamin and mineral supplements as recommended by your health care provider. What happens during an annual well check? The services and screenings done by your health care provider during your annual well check will depend on your age, overall health, lifestyle risk factors, and family history of disease. Counseling  Your health care provider may ask you questions about your: Alcohol use. Tobacco use. Drug use. Emotional well-being. Home and relationship well-being. Sexual activity. Eating habits. History of falls. Memory and ability to understand  (cognition). Work and work Astronomer. Screening  You may have the following tests or measurements: Height, weight, and BMI. Blood pressure. Lipid and cholesterol levels. These may be checked every 5 years, or more frequently if you are over 41 years old. Skin check. Lung cancer screening. You may have this screening every year starting at age 29 if you have a 30-pack-year history of smoking and currently smoke or have quit within the past 15 years. Fecal occult blood test (FOBT) of the stool. You may have this test every year starting at age 72. Flexible sigmoidoscopy or colonoscopy. You may have a sigmoidoscopy every 5 years or a colonoscopy every 10 years starting at age 58. Prostate cancer screening. Recommendations will vary depending on your family history and other risks. Hepatitis C blood test. Hepatitis B blood test. Sexually transmitted disease (STD) testing. Diabetes screening. This is done by checking your blood sugar (glucose) after you have not eaten for a while (fasting). You may have this done every 1-3 years. Abdominal aortic aneurysm (AAA) screening. You may need this if you are a current or former smoker. Osteoporosis. You may be screened starting at age 62 if you are at high risk. Talk with your health care provider about your test results, treatment options, and if necessary, the need for more tests. Vaccines  Your health care provider may recommend certain vaccines, such as: Influenza vaccine. This is recommended every year. Tetanus, diphtheria, and acellular pertussis (Tdap, Td) vaccine. You may need a Td booster every 10 years. Zoster vaccine. You may need this after age 77. Pneumococcal 13-valent conjugate (PCV13) vaccine. One dose is recommended after age 35. Pneumococcal polysaccharide (PPSV23) vaccine. One dose is recommended after age 29. Talk to your health care provider about which screenings and  vaccines you need and how often you need them. This  information is not intended to replace advice given to you by your health care provider. Make sure you discuss any questions you have with your health care provider. Document Released: 01/08/2016 Document Revised: 08/31/2016 Document Reviewed: 10/13/2015 Elsevier Interactive Patient Education  2017 [employer] Prevention in the Home Falls can cause injuries. They can happen to people of all ages. There are many things you can do to make your home safe and to help prevent falls. What can I do on the outside of my home? Regularly fix the edges of walkways and driveways and fix any cracks. Remove anything that might make you trip as you walk through a door, such as a raised step or threshold. Trim any bushes or trees on the path to your home. Use bright outdoor lighting. Clear any walking paths of anything that might make someone trip, such as rocks or tools. Regularly check to see if handrails are loose or broken. Make sure that both sides of any steps have handrails. Any raised decks and porches should have guardrails on the edges. Have any leaves, snow, or ice cleared regularly. Use sand or salt on walking paths during winter. Clean up any spills in your garage right away. This includes oil or grease spills. What can I do in the bathroom? Use night lights. Install grab bars by the toilet and in the tub and shower. Do not use towel bars as grab bars. Use non-skid mats or decals in the tub or shower. If you need to sit down in the shower, use a plastic, non-slip stool. Keep the floor dry. Clean up any water that spills on the floor as soon as it happens. Remove soap buildup in the tub or shower regularly. Attach bath mats securely with double-sided non-slip rug tape. Do not have throw rugs and other things on the floor that can make you trip. What can I do in the bedroom? Use night lights. Make sure that you have a light by your bed that is easy to reach. Do not use any sheets or  blankets that are too big for your bed. They should not hang down onto the floor. Have a firm chair that has side arms. You can use this for support while you get dressed. Do not have throw rugs and other things on the floor that can make you trip. What can I do in the kitchen? Clean up any spills right away. Avoid walking on wet floors. Keep items that you use a lot in easy-to-reach places. If you need to reach something above you, use a strong step stool that has a grab bar. Keep electrical cords out of the way. Do not use floor polish or wax that makes floors slippery. If you must use wax, use non-skid floor wax. Do not have throw rugs and other things on the floor that can make you trip. What can I do with my stairs? Do not leave any items on the stairs. Make sure that there are handrails on both sides of the stairs and use them. Fix handrails that are broken or loose. Make sure that handrails are as long as the stairways. Check any carpeting to make sure that it is firmly attached to the stairs. Fix any carpet that is loose or worn. Avoid having throw rugs at the top or bottom of the stairs. If you do have throw rugs, attach them to the floor with carpet tape. Make  sure that you have a light switch at the top of the stairs and the bottom of the stairs. If you do not have them, ask someone to add them for you. What else can I do to help prevent falls? Wear shoes that: Do not have high heels. Have rubber bottoms. Are comfortable and fit you well. Are closed at the toe. Do not wear sandals. If you use a stepladder: Make sure that it is fully opened. Do not climb a closed stepladder. Make sure that both sides of the stepladder are locked into place. Ask someone to hold it for you, if possible. Clearly mark and make sure that you can see: Any grab bars or handrails. First and last steps. Where the edge of each step is. Use tools that help you move around (mobility aids) if they are  needed. These include: Canes. Walkers. Scooters. Crutches. Turn on the lights when you go into a dark area. Replace any light bulbs as soon as they burn out. Set up your furniture so you have a clear path. Avoid moving your furniture around. If any of your floors are uneven, fix them. If there are any pets around you, be aware of where they are. Review your medicines with your doctor. Some medicines can make you feel dizzy. This can increase your chance of falling. Ask your doctor what other things that you can do to help prevent falls. This information is not intended to replace advice given to you by your health care provider. Make sure you discuss any questions you have with your health care provider. Document Released: 10/08/2009 Document Revised: 05/19/2016 Document Reviewed: 01/16/2015 Elsevier Interactive Patient Education  2017 [employer].

## 2021-08-06 NOTE — Progress Notes (Signed)
Subjective:   Jose Villa is a 70 y.o. male who presents for Medicare Annual/Subsequent preventive examination.  I connected with  Jose Villa on 08/06/21 by an audio enabled telemedicine application and verified that I am speaking with the correct person using two identifiers.   I discussed the limitations, risks, security and privacy concerns of performing an evaluation and management service by telephone and the availability of in person appointments. I also discussed with the patient that there may be a patient responsible charge related to this service. The patient expressed understanding and verbally consented to this telephonic visit.  Review of Systems           Objective:    There were no vitals filed for this visit. There is no height or weight on file to calculate BMI.  Advanced Directives 02/13/2019  Does Patient Have a Medical Advance Directive? No  Would patient like information on creating a medical advance directive? No - Patient declined    Current Medications (verified) Outpatient Encounter Medications as of 08/06/2021  Medication Sig   albuterol (VENTOLIN HFA) 108 (90 Base) MCG/ACT inhaler Inhale 2 puffs into the lungs every 4 (four) hours as needed for wheezing.   amLODipine (NORVASC) 5 MG tablet Take 1 tablet (5 mg total) by mouth daily.   atorvastatin (LIPITOR) 40 MG tablet Take 1 tablet (40 mg total) by mouth daily.   Budeson-Glycopyrrol-Formoterol (BREZTRI AEROSPHERE) 160-9-4.8 MCG/ACT AERO Inhale 2 puffs into the lungs in the morning and at bedtime.   cholecalciferol (VITAMIN D3) 25 MCG (1000 UT) tablet Take 1,000 Units by mouth daily.   lisinopril (ZESTRIL) 20 MG tablet Take 1 tablet (20 mg total) by mouth daily.   Omega-3 Fatty Acids (FISH OIL) 1000 MG CAPS Take 1 capsule by mouth daily.   No facility-administered encounter medications on file as of 08/06/2021.    Allergies (verified) Coconut oil fatty acid diethanolamide [cocamide dea]    History: Past Medical History:  Diagnosis Date   Closed fracture of lateral malleolus 12/02/2010   Qualifier: Diagnosis of  By: Romeo Apple MD, Duffy Rhody     COPD (chronic obstructive pulmonary disease) (HCC)    Hearing loss, central    Past Surgical History:  Procedure Laterality Date   FRACTURE SURGERY     right ankle   Family History  Problem Relation Age of Onset   Cancer Mother    Asthma Sister    Social History   Socioeconomic History   Marital status: Single    Spouse name: Not on file   Number of children: Not on file   Years of education: Not on file   Highest education level: Not on file  Occupational History   Occupation: retired  Tobacco Use   Smoking status: Former    Packs/day: 1.00    Types: Cigarettes    Quit date: 12/26/2016    Years since quitting: 4.6   Smokeless tobacco: Never  Vaping Use   Vaping Use: Never used  Substance and Sexual Activity   Alcohol use: Yes    Comment: 2 beers per day; one 12 pack per week   Drug use: No   Sexual activity: Not Currently  Other Topics Concern   Not on file  Social History Narrative   Not on file   Social Determinants of Health   Financial Resource Strain: Not on file  Food Insecurity: Not on file  Transportation Needs: Not on file  Physical Activity: Not on file  Stress: Not on file  Social Connections: Not on file    Tobacco Counseling Counseling given: Not Answered   Clinical Intake:                 Diabetic?No         Activities of Daily Living No flowsheet data found.  Patient Care Team: Heather Roberts, NP as PCP - General (Nurse Practitioner)  Indicate any recent Medical Services you may have received from other than [facility] providers in the past year (date may be approximate).     Assessment:   This is a routine wellness examination for Jose Villa.  Hearing/Vision screen No results found.  Dietary issues and exercise activities discussed:     Goals Addressed    None   Depression Screen PHQ 2/9 Scores 04/21/2021 03/17/2021 02/17/2021 02/03/2021  PHQ - 2 Score 0 0 0 2  PHQ- 9 Score 0 0 0 4    Fall Risk Fall Risk  04/21/2021 03/17/2021 02/17/2021 02/03/2021  Falls in the past year? 0 0 0 0  Number falls in past yr: 0 0 0 0  Injury with Fall? 0 0 0 0  Risk for fall due to : No Fall Risks No Fall Risks No Fall Risks No Fall Risks  Follow up Falls evaluation completed Falls evaluation completed Falls evaluation completed Falls evaluation completed    FALL RISK PREVENTION PERTAINING TO THE HOME:  Any stairs in or around the home? No  If so, are there any without handrails? No  Home free of loose throw rugs in walkways, pet beds, electrical cords, etc? Yes  Adequate lighting in your home to reduce risk of falls? Yes   ASSISTIVE DEVICES UTILIZED TO PREVENT FALLS:  Life alert? No  Use of a cane, walker or w/c? No  Grab bars in the bathroom? No  Shower chair or bench in shower? No  Elevated toilet seat or a handicapped toilet? No   TIMED UP AND GO:  Was the test performed? No .  Length of time to ambulate 10 feet: NA sec.       Immunizations Immunization History  Administered Date(s) Administered   [employer] Vaccination 02/24/2020, 03/26/2020   Tdap 03/26/2020    TDAP status: Up to date  Flu Vaccine status: Due, Education has been provided regarding the importance of this vaccine. Advised may receive this vaccine at local pharmacy or Health Dept. Aware to provide a copy of the vaccination record if obtained from local pharmacy or Health Dept. Verbalized acceptance and understanding.  Pneumococcal vaccine status: Due, Education has been provided regarding the importance of this vaccine. Advised may receive this vaccine at local pharmacy or Health Dept. Aware to provide a copy of the vaccination record if obtained from local pharmacy or Health Dept. Verbalized acceptance and understanding.  Covid-19 vaccine status: Completed  vaccines  Qualifies for Shingles Vaccine? Yes   Zostavax completed No   Shingrix Completed?: No.    Education has been provided regarding the importance of this vaccine. Patient has been advised to call insurance company to determine out of pocket expense if they have not yet received this vaccine. Advised may also receive vaccine at local pharmacy or Health Dept. Verbalized acceptance and understanding.  Screening Tests Health Maintenance  Topic Date Due   Zoster Vaccines- Shingrix (1 of 2) Never done   PNA vac Low Risk Adult (1 of 2 - PCV13) Never done   COVID-19 Vaccine (3 - Booster for Moderna series) 08/26/2020   INFLUENZA VACCINE  07/26/2021  COLONOSCOPY (Pts 45-59yrs Insurance coverage will need to be confirmed)  08/29/2021   TETANUS/TDAP  03/26/2030   Hepatitis C Screening  Completed   HPV VACCINES  Aged Out    Health Maintenance  Health Maintenance Due  Topic Date Due   Zoster Vaccines- Shingrix (1 of 2) Never done   PNA vac Low Risk Adult (1 of 2 - PCV13) Never done   COVID-19 Vaccine (3 - Booster for Moderna series) 08/26/2020   INFLUENZA VACCINE  07/26/2021    Colorectal cancer screening: Referral to GI placed Patient declined. Pt aware the office will call re: appt.  Lung Cancer Screening: (Low Dose CT Chest recommended if Age 34-80 years, 30 pack-year currently smoking OR have quit w/in 15years.) does qualify.   Lung Cancer Screening Referral: Does not want at this time   Additional Screening:  Hepatitis C Screening: does qualify; Completed 02-12-21  Vision Screening: Recommended annual ophthalmology exams for early detection of glaucoma and other disorders of the eye. Is the patient up to date with their annual eye exam?  Yes  Who is the provider or what is the name of the office in which the patient attends annual eye exams? [facility] If pt is not established with a provider, would they like to be referred to a provider to establish care? No .    Dental Screening: Recommended annual dental exams for proper oral hygiene  Community Resource Referral / Chronic Care Management: CRR required this visit?  No   CCM required this visit?  No      Plan:     I have personally reviewed and noted the following in the patient's chart:   Medical and social history Use of alcohol, tobacco or illicit drugs  Current medications and supplements including opioid prescriptions. Patient is not currently taking opioid prescriptions. Functional ability and status Nutritional status Physical activity Advanced directives List of other physicians Hospitalizations, surgeries, and ER visits in previous 12 months Vitals Screenings to include cognitive, depression, and falls Referrals and appointments  In addition, I have reviewed and discussed with patient certain preventive protocols, quality metrics, and best practice recommendations. A written personalized care plan for preventive services as well as general preventive health recommendations were provided to patient.     Park Breed, CMA   08/06/2021   Nurse Notes: Patient was at home. The provider was Geralynn Rile NP who was in office. This was a telehealth visit.

## 2021-08-09 ENCOUNTER — Telehealth: Payer: Self-pay | Admitting: Nurse Practitioner

## 2021-08-09 ENCOUNTER — Other Ambulatory Visit: Payer: Self-pay

## 2021-08-09 MED ORDER — ALBUTEROL SULFATE HFA 108 (90 BASE) MCG/ACT IN AERS: 2.0000 | INHALATION_SPRAY | RESPIRATORY_TRACT | 3 refills | Status: DC | PRN

## 2021-08-09 NOTE — Telephone Encounter (Signed)
Rx sent 

## 2021-08-09 NOTE — Telephone Encounter (Signed)
Please send albuterol in to [facility] on [facility]

## 2021-11-29 ENCOUNTER — Telehealth: Payer: Self-pay

## 2021-11-29 ENCOUNTER — Other Ambulatory Visit: Payer: Self-pay | Admitting: *Deleted

## 2021-11-29 DIAGNOSIS — J449 Chronic obstructive pulmonary disease, unspecified: Secondary | ICD-10-CM

## 2021-11-29 MED ORDER — BREZTRI AEROSPHERE 160-9-4.8 MCG/ACT IN AERO: 2.0000 | INHALATION_SPRAY | Freq: Two times a day (BID) | RESPIRATORY_TRACT | 3 refills | Status: DC

## 2021-11-29 NOTE — Telephone Encounter (Signed)
Patient called need med refill Breztri 160 mcg inhaler  [facility] [address] [place]

## 2021-11-29 NOTE — Telephone Encounter (Signed)
Rx was sent into pharmacy.  

## 2022-01-03 ENCOUNTER — Ambulatory Visit (INDEPENDENT_AMBULATORY_CARE_PROVIDER_SITE_OTHER): Payer: Medicare Other | Admitting: Nurse Practitioner

## 2022-01-03 ENCOUNTER — Encounter: Payer: Self-pay | Admitting: Nurse Practitioner

## 2022-01-03 ENCOUNTER — Other Ambulatory Visit: Payer: Self-pay

## 2022-01-03 VITALS — BP 148/98 | HR 74 | Ht 71.0 in | Wt 241.0 lb

## 2022-01-03 DIAGNOSIS — E7800 Pure hypercholesterolemia, unspecified: Secondary | ICD-10-CM

## 2022-01-03 DIAGNOSIS — I10 Essential (primary) hypertension: Secondary | ICD-10-CM | POA: Diagnosis not present

## 2022-01-03 DIAGNOSIS — J449 Chronic obstructive pulmonary disease, unspecified: Secondary | ICD-10-CM

## 2022-01-03 DIAGNOSIS — H6122 Impacted cerumen, left ear: Secondary | ICD-10-CM | POA: Diagnosis not present

## 2022-01-03 DIAGNOSIS — Z6833 Body mass index (BMI) 33.0-33.9, adult: Secondary | ICD-10-CM

## 2022-01-03 DIAGNOSIS — E669 Obesity, unspecified: Secondary | ICD-10-CM

## 2022-01-03 MED ORDER — AMLODIPINE BESYLATE 5 MG PO TABS: 5.0000 mg | ORAL_TABLET | Freq: Every day | ORAL | 3 refills | Status: DC

## 2022-01-03 NOTE — Assessment & Plan Note (Addendum)
DASH diet and commitment to daily physical activity for a minimum of 30 minutes discussed and encouraged, as a part of hypertension management. The importance of attaining a healthy weight is also discussed.  BP/Weight 01/03/2022 04/21/2021 03/17/2021 02/17/2021 02/03/2021 02/13/2019 09/26/2011  Systolic BP 148 143 169 170 178 123 136  Diastolic BP 98 73 105 91 95 80 80  Wt. (Lbs) 241 235 236 229 230 212 205  BMI 33.61 32.78 32.92 31.94 32.08 29.57 28.6   Takes lisinopril 20mg  daily. He  Has not been taking amlodipine 5mg  prescribed because medicine ws too expensive at [facility].   [facility] RX [facility] today. Pt told to take both amlodipine and lisinopril.

## 2022-01-03 NOTE — Assessment & Plan Note (Signed)
Lipid panel today.  Takes atorvastatin 40mg  daily.

## 2022-01-03 NOTE — Assessment & Plan Note (Signed)
Importance of healthy food choices with portion control discussed as well as eating regularly within 12  hour window.   The need to choose clean green food 50%-75% of time is discussed as well as make water the primary drink and set a goal for 64 ounces daily.  Patient reeducated about the importance of committment to minimum of 150 minutes of exercise per week.  Three meals at set times with snacks allowed between meals but they must be fruit or vegetable.   Aim to eat  over 12 hour period  for example 7 am to 7 pm. Stop after your last meal of the day.   goes on exercise bike 30 minutes a day, goes [facility] to walk around twice a week. He has been using  air fryers, no more deep fryers. Wt Readings from Last 3 Encounters:  01/03/22 241 lb (109.3 kg)  04/21/21 235 lb (106.6 kg)  03/17/21 236 lb (107 kg)

## 2022-01-03 NOTE — Patient Instructions (Addendum)
Please get your shingles, pneumonia  and covid vaccine at your pharmacy  Take amlodipine 5mg  and lisinopril 20mg  daily for your blood pressure.    It is important that you exercise regularly at least 30 minutes 5 times a week.  Think about what you will eat, plan ahead. Choose " clean, green, fresh or frozen" over canned, processed or packaged foods which are more sugary, salty and fatty. 70 to 75% of food eaten should be vegetables and fruit. Three meals at set times with snacks allowed between meals, but they must be fruit or vegetables. Aim to eat over a 12 hour period , example 7 am to 7 pm, and STOP after  your last meal of the day. Drink water,generally about 64 ounces per day, no other drink is as healthy. Fruit juice is best enjoyed in a healthy way, by EATING the fruit.  Thanks for choosing [facility], we consider it a privelige to serve you.

## 2022-01-03 NOTE — Progress Notes (Signed)
° °  Jose Villa     MRN: [id number]      DOB: Jul 31, 1951   HPI Mr. Cronic is here for follow up and re-evaluation of chronic medical conditions, medication management and review of any available recent lab and radiology data.  Preventive health is updated, specifically  Cancer screening and Immunization.   Questions or concerns regarding consultations or procedures which the PT has had in the interim are  addressed. The PT denies any adverse reactions to current medications since the last visit.   Pt wears hearing aids, would like to have his ears checked, feels like he has wax in his ears, denies ear pain pain, discharge from the ears.   Pt is due for colonoscopy, shingles, pneumonia  and covid vaccine   ROS Denies recent fever or chills. Denies sinus pressure, nasal congestion, ear pain or sore throat. Wears hearing aid, left ear feels like it has wax Denies chest congestion, productive cough or wheezing. Denies chest pains, palpitations and leg swelling Denies abdominal pain, nausea, vomiting,diarrhea or constipation.   Denies dysuria, frequency, hesitancy or incontinence. Denies joint pain, swelling and limitation in mobility. Denies headaches, seizures, numbness, or tingling. Denies depression, anxiety or insomnia. Denies skin break down or rash.   PE  BP (!) 165/91 (BP Location: Right Arm, Patient Position: Sitting, Cuff Size: Large)    Pulse 74    Ht 5\' 11"  (1.803 m)    Wt 241 lb (109.3 kg)    BMI 33.61 kg/m   Patient alert and oriented and in no cardiopulmonary distress.  HEENT: No facial asymmetry,     Neck supple . Wax impaction in left ear,lesion noted in the left ear canal after cleaning, I was unable to visualize the TM, PT stated that he has an upcoming apt with ENT.  right ear appear normal.   Chest: Clear to auscultation bilaterally.  CVS: S1, S2 no murmurs, no S3.Regular rate.  ABD: Soft non tender.   Ext: No edema  MS: Adequate ROM spine, shoulders, hips and  knees.  Skin: Intact, no ulcerations or rash noted.  Psych: Good eye contact, normal affect. Memory intact not anxious or depressed appearing.  CNS: CN 2-12 intact, power,  normal throughout.no focal deficits noted.   Assessment & Plan

## 2022-01-03 NOTE — Assessment & Plan Note (Signed)
Well controlled.continue albuterol inhaler PRN, Breztri BID

## 2022-01-03 NOTE — Assessment & Plan Note (Addendum)
Ear flushed today. lesion noted in the left ear canal after cleaning,Pt stated that he has an upcoming apt with ENT. Pt stated that he ears better after the cleaning.

## 2022-01-04 ENCOUNTER — Other Ambulatory Visit: Payer: Self-pay | Admitting: Nurse Practitioner

## 2022-01-04 DIAGNOSIS — Z1211 Encounter for screening for malignant neoplasm of colon: Secondary | ICD-10-CM

## 2022-01-04 DIAGNOSIS — Z1212 Encounter for screening for malignant neoplasm of rectum: Secondary | ICD-10-CM

## 2022-01-04 DIAGNOSIS — Z139 Encounter for screening, unspecified: Secondary | ICD-10-CM

## 2022-01-04 LAB — CMP14+EGFR
ALT: 33 IU/L (ref 0–44)
AST: 41 IU/L — ABNORMAL HIGH (ref 0–40)
Albumin/Globulin Ratio: 2 (ref 1.2–2.2)
Albumin: 4.8 g/dL (ref 3.8–4.8)
Alkaline Phosphatase: 84 IU/L (ref 44–121)
BUN/Creatinine Ratio: 14 (ref 10–24)
BUN: 13 mg/dL (ref 8–27)
Bilirubin Total: 0.7 mg/dL (ref 0.0–1.2)
CO2: 25 mmol/L (ref 20–29)
Calcium: 10.2 mg/dL (ref 8.6–10.2)
Chloride: 100 mmol/L (ref 96–106)
Creatinine, Ser: 0.93 mg/dL (ref 0.76–1.27)
Globulin, Total: 2.4 g/dL (ref 1.5–4.5)
Glucose: 93 mg/dL (ref 70–99)
Potassium: 4.8 mmol/L (ref 3.5–5.2)
Sodium: 140 mmol/L (ref 134–144)
Total Protein: 7.2 g/dL (ref 6.0–8.5)
eGFR: 89 mL/min/{1.73_m2} (ref >59)

## 2022-01-04 LAB — LIPID PANEL
Chol/HDL Ratio: 3.9 ratio (ref 0.0–5.0)
Cholesterol, Total: 146 mg/dL (ref 100–199)
HDL: 37 mg/dL — ABNORMAL LOW (ref >39)
LDL Chol Calc (NIH): 87 mg/dL (ref 0–99)
Triglycerides: 123 mg/dL (ref 0–149)
VLDL Cholesterol Cal: 22 mg/dL (ref 5–40)

## 2022-01-05 NOTE — Progress Notes (Signed)
Patient stopped in the office today. All results reviewed. Patient verbalized understanding,

## 2022-01-10 ENCOUNTER — Encounter: Payer: Self-pay | Admitting: Internal Medicine

## 2022-02-03 ENCOUNTER — Encounter (INDEPENDENT_AMBULATORY_CARE_PROVIDER_SITE_OTHER): Payer: Self-pay

## 2022-02-03 ENCOUNTER — Encounter: Payer: Self-pay | Admitting: Nurse Practitioner

## 2022-02-03 ENCOUNTER — Ambulatory Visit (INDEPENDENT_AMBULATORY_CARE_PROVIDER_SITE_OTHER): Payer: Medicare Other | Admitting: Nurse Practitioner

## 2022-02-03 ENCOUNTER — Other Ambulatory Visit: Payer: Self-pay

## 2022-02-03 VITALS — BP 134/78 | HR 68 | Ht 71.0 in | Wt 237.1 lb

## 2022-02-03 DIAGNOSIS — E669 Obesity, unspecified: Secondary | ICD-10-CM | POA: Diagnosis not present

## 2022-02-03 DIAGNOSIS — I10 Essential (primary) hypertension: Secondary | ICD-10-CM | POA: Diagnosis not present

## 2022-02-03 DIAGNOSIS — Z139 Encounter for screening, unspecified: Secondary | ICD-10-CM

## 2022-02-03 DIAGNOSIS — Z6833 Body mass index (BMI) 33.0-33.9, adult: Secondary | ICD-10-CM

## 2022-02-03 DIAGNOSIS — E7800 Pure hypercholesterolemia, unspecified: Secondary | ICD-10-CM | POA: Diagnosis not present

## 2022-02-03 NOTE — Assessment & Plan Note (Signed)
Lab Results  Component Value Date   CHOL 146 01/03/2022   HDL 37 (L) 01/03/2022   LDLCALC 87 01/03/2022   TRIG 123 01/03/2022   CHOLHDL 3.9 01/03/2022  continue current med

## 2022-02-03 NOTE — Patient Instructions (Addendum)
°  Please get your pneumonia vaccine today  Pleas get your labs done 3-5 days before your next visit.   It is important that you exercise regularly at least 30 minutes 5 times a week.  Think about what you will eat, plan ahead. Choose " clean, green, fresh or frozen" over canned, processed or packaged foods which are more sugary, salty and fatty. 70 to 75% of food eaten should be vegetables and fruit. Three meals at set times with snacks allowed between meals, but they must be fruit or vegetables. Aim to eat over a 12 hour period , example 7 am to 7 pm, and STOP after  your last meal of the day. Drink water,generally about 64 ounces per day, no other drink is as healthy. Fruit juice is best enjoyed in a healthy way, by EATING the fruit.  Thanks for choosing [facility], we consider it a privelige to serve you.

## 2022-02-03 NOTE — Progress Notes (Signed)
° °  Cleon Signorelli     MRN: [id number]      DOB: 09-15-1951   HPI Mr. Koch is here for follow up and re-evaluation of chronic medical conditions, medication management and review of any available recent lab and radiology data.  Preventive health is updated, specifically  Cancer screening and Immunization.   Questions or concerns regarding consultations or procedures which the PT has had in the interim are  addressed.     Pt is here for follow up for BP, he stopped taking amlodipine because it made him constipated. He tried using laxatives , nothing helped. He stopped taking amlodipine after taking med for 2 weeks, he has not been having problems with constipations since he stopped amlodipine.   ROS Denies recent fever or chills. Denies sinus pressure, nasal congestion, ear pain or sore throat. Denies chest congestion, productive cough or wheezing. Denies chest pains, palpitations and leg swelling Denies abdominal pain, nausea, vomiting,diarrhea or constipation.   Denies dysuria, frequency, hesitancy or incontinence. Denies joint pain, swelling and limitation in mobility. Denies headaches, seizures, numbness, or tingling. Denies depression, anxiety or insomnia. Denies skin break down or rash.   PE  BP 134/78 (BP Location: Right Arm, Cuff Size: Normal)    Pulse 68    Ht 5\' 11"  (1.803 m)    Wt 237 lb 1.3 oz (107.5 kg)    SpO2 95%    BMI 33.07 kg/m   Patient alert and oriented and in no cardiopulmonary distress.   Chest: Clear to auscultation bilaterally.  CVS: S1, S2 no murmurs, no S3.Regular rate.  ABD: Soft non tender.   Ext: No edema  MS: Adequate ROM spine, shoulders, hips and knees.  Skin: Intact, no ulcerations or rash noted.  Psych: Good eye contact, normal affect. Memory intact not anxious or depressed appearing.  CNS: CN 2-12 intact, power,  normal throughout.no focal deficits noted.   Assessment & Plan

## 2022-02-03 NOTE — Assessment & Plan Note (Signed)
DASH diet and commitment to daily physical activity for a minimum of 30 minutes discussed and encouraged, as a part of hypertension management. The importance of attaining a healthy weight is also discussed.  BP/Weight 02/03/2022 01/03/2022 04/21/2021 03/17/2021 02/17/2021 02/03/2021 02/13/2019  Systolic BP 134 148 143 169 170 178 123  Diastolic BP 78 98 73 105 91 95 80  Wt. (Lbs) 237.08 241 235 236 229 230 212  BMI 33.07 33.61 32.78 32.92 31.94 32.08 29.57  stopped taking amlodipine 5mg  due to constipation BP is normal today. Continue lisinopril 20mg  daily.

## 2022-02-03 NOTE — Assessment & Plan Note (Signed)
Importance of healthy food choices with portion control discussed as well as eating regularly within 12  hour window.   The need to choose clean green food 50%-75% of time is discussed as well as make water the primary drink and set a goal for 64 ounces daily.  Patient reeducated about the importance of committment to minimum of 150 minutes of exercise per week.  Three meals at set times with snacks allowed between meals but they must be fruit or vegetable.   Aim to eat  over 12 hour period  for example 7 am to 7 pm. Stop after your last meal of the day.  Wt Readings from Last 3 Encounters:  02/03/22 237 lb 1.3 oz (107.5 kg)  01/03/22 241 lb (109.3 kg)  04/21/21 235 lb (106.6 kg)

## 2022-02-04 ENCOUNTER — Telehealth: Payer: Self-pay | Admitting: Nurse Practitioner

## 2022-02-04 ENCOUNTER — Other Ambulatory Visit: Payer: Self-pay

## 2022-02-04 MED ORDER — ATORVASTATIN CALCIUM 40 MG PO TABS: 40.0000 mg | ORAL_TABLET | Freq: Every day | ORAL | 3 refills | Status: DC

## 2022-02-04 MED ORDER — LISINOPRIL 20 MG PO TABS: 20.0000 mg | ORAL_TABLET | Freq: Every day | ORAL | 3 refills | Status: DC

## 2022-02-04 NOTE — Telephone Encounter (Signed)
refilled 

## 2022-02-04 NOTE — Telephone Encounter (Signed)
Pt came by office , needs refills on   atorvastatin (LIPITOR) 40 MG tablet    lisinopril (ZESTRIL) 20 MG    [facility] [place]

## 2022-03-09 ENCOUNTER — Ambulatory Visit: Payer: Medicare Other

## 2022-04-26 ENCOUNTER — Other Ambulatory Visit: Payer: Self-pay

## 2022-04-26 ENCOUNTER — Telehealth: Payer: Self-pay | Admitting: Nurse Practitioner

## 2022-04-26 DIAGNOSIS — J449 Chronic obstructive pulmonary disease, unspecified: Secondary | ICD-10-CM

## 2022-04-26 MED ORDER — BREZTRI AEROSPHERE 160-9-4.8 MCG/ACT IN AERO: 2.0000 | INHALATION_SPRAY | Freq: Two times a day (BID) | RESPIRATORY_TRACT | 3 refills | Status: DC

## 2022-04-26 NOTE — Telephone Encounter (Signed)
Rx sent 

## 2022-04-26 NOTE — Telephone Encounter (Signed)
Patient needs refill on  ? ?Budeson-Glycopyrrol-Formoterol (BREZTRI AEROSPHERE) 160-9-4.8 MCG/ACT AERO  ? ? ?[facility] on [address], [place]  ?

## 2022-06-02 DIAGNOSIS — I10 Essential (primary) hypertension: Secondary | ICD-10-CM | POA: Diagnosis not present

## 2022-06-02 DIAGNOSIS — Z139 Encounter for screening, unspecified: Secondary | ICD-10-CM | POA: Diagnosis not present

## 2022-06-03 LAB — CBC WITH DIFFERENTIAL/PLATELET
Basophils Absolute: 0.1 10*3/uL (ref 0.0–0.2)
Basos: 1 %
EOS (ABSOLUTE): 0.4 10*3/uL (ref 0.0–0.4)
Eos: 4 %
Hematocrit: 52.4 % — ABNORMAL HIGH (ref 37.5–51.0)
Hemoglobin: 17.5 g/dL (ref 13.0–17.7)
Immature Grans (Abs): 0.1 10*3/uL (ref 0.0–0.1)
Immature Granulocytes: 1 %
Lymphocytes Absolute: 2.3 10*3/uL (ref 0.7–3.1)
Lymphs: 27 %
MCH: 31.8 pg (ref 26.6–33.0)
MCHC: 33.4 g/dL (ref 31.5–35.7)
MCV: 95 fL (ref 79–97)
Monocytes Absolute: 0.9 10*3/uL (ref 0.1–0.9)
Monocytes: 11 %
Neutrophils Absolute: 4.8 10*3/uL (ref 1.4–7.0)
Neutrophils: 56 %
Platelets: 243 10*3/uL (ref 150–450)
RBC: 5.51 x10E6/uL (ref 4.14–5.80)
RDW: 12.3 % (ref 11.6–15.4)
WBC: 8.6 10*3/uL (ref 3.4–10.8)

## 2022-06-03 LAB — LIPID PANEL
Chol/HDL Ratio: 4.1 ratio (ref 0.0–5.0)
Cholesterol, Total: 148 mg/dL (ref 100–199)
HDL: 36 mg/dL — ABNORMAL LOW (ref >39)
LDL Chol Calc (NIH): 93 mg/dL (ref 0–99)
Triglycerides: 102 mg/dL (ref 0–149)
VLDL Cholesterol Cal: 19 mg/dL (ref 5–40)

## 2022-06-03 LAB — CMP14+EGFR
ALT: 23 IU/L (ref 0–44)
AST: 27 IU/L (ref 0–40)
Albumin/Globulin Ratio: 1.8 (ref 1.2–2.2)
Albumin: 4.9 g/dL — ABNORMAL HIGH (ref 3.8–4.8)
Alkaline Phosphatase: 102 IU/L (ref 44–121)
BUN/Creatinine Ratio: 15 (ref 10–24)
BUN: 14 mg/dL (ref 8–27)
Bilirubin Total: 0.7 mg/dL (ref 0.0–1.2)
CO2: 26 mmol/L (ref 20–29)
Calcium: 10.1 mg/dL (ref 8.6–10.2)
Chloride: 97 mmol/L (ref 96–106)
Creatinine, Ser: 0.93 mg/dL (ref 0.76–1.27)
Globulin, Total: 2.8 g/dL (ref 1.5–4.5)
Glucose: 94 mg/dL (ref 70–99)
Potassium: 4.3 mmol/L (ref 3.5–5.2)
Sodium: 138 mmol/L (ref 134–144)
Total Protein: 7.7 g/dL (ref 6.0–8.5)
eGFR: 88 mL/min/{1.73_m2} (ref >59)

## 2022-06-03 LAB — HEMOGLOBIN A1C
Est. average glucose Bld gHb Est-mCnc: 140 mg/dL
Hgb A1c MFr Bld: 6.5 % — ABNORMAL HIGH (ref 4.8–5.6)

## 2022-06-03 LAB — VITAMIN D 25 HYDROXY (VIT D DEFICIENCY, FRACTURES): Vit D, 25-Hydroxy: 34.3 ng/mL (ref 30.0–100.0)

## 2022-06-03 LAB — PSA: Prostate Specific Ag, Serum: 4.3 ng/mL — ABNORMAL HIGH (ref 0.0–4.0)

## 2022-06-03 LAB — TSH: TSH: 6.18 u[IU]/mL — ABNORMAL HIGH (ref 0.450–4.500)

## 2022-06-03 NOTE — Progress Notes (Signed)
I will dicuss results with pt at his upcoming appointment

## 2022-06-07 ENCOUNTER — Encounter: Payer: Medicare Other | Admitting: Nurse Practitioner

## 2022-06-08 ENCOUNTER — Encounter: Payer: Self-pay | Admitting: Nurse Practitioner

## 2022-06-08 ENCOUNTER — Ambulatory Visit (INDEPENDENT_AMBULATORY_CARE_PROVIDER_SITE_OTHER): Payer: Medicare Other | Admitting: Nurse Practitioner

## 2022-06-08 VITALS — BP 139/87 | HR 72 | Ht 71.0 in | Wt 238.0 lb

## 2022-06-08 DIAGNOSIS — Z23 Encounter for immunization: Secondary | ICD-10-CM | POA: Insufficient documentation

## 2022-06-08 DIAGNOSIS — E118 Type 2 diabetes mellitus with unspecified complications: Secondary | ICD-10-CM | POA: Diagnosis not present

## 2022-06-08 DIAGNOSIS — Z0001 Encounter for general adult medical examination with abnormal findings: Secondary | ICD-10-CM

## 2022-06-08 DIAGNOSIS — R972 Elevated prostate specific antigen [PSA]: Secondary | ICD-10-CM | POA: Insufficient documentation

## 2022-06-08 DIAGNOSIS — Z1211 Encounter for screening for malignant neoplasm of colon: Secondary | ICD-10-CM | POA: Diagnosis not present

## 2022-06-08 DIAGNOSIS — E782 Mixed hyperlipidemia: Secondary | ICD-10-CM

## 2022-06-08 DIAGNOSIS — R7989 Other specified abnormal findings of blood chemistry: Secondary | ICD-10-CM

## 2022-06-08 DIAGNOSIS — I10 Essential (primary) hypertension: Secondary | ICD-10-CM

## 2022-06-08 DIAGNOSIS — Z0000 Encounter for general adult medical examination without abnormal findings: Secondary | ICD-10-CM

## 2022-06-08 MED ORDER — UNABLE TO FIND: 0 refills | Status: DC

## 2022-06-08 MED ORDER — HYDROCHLOROTHIAZIDE 12.5 MG PO TABS: 12.5000 mg | ORAL_TABLET | Freq: Every day | ORAL | 1 refills | Status: DC

## 2022-06-08 MED ORDER — EZETIMIBE 10 MG PO TABS: 10.0000 mg | ORAL_TABLET | Freq: Every day | ORAL | 3 refills | Status: DC

## 2022-06-08 NOTE — Assessment & Plan Note (Addendum)
Lab Results  Component Value Date   CHOL 148 06/02/2022   HDL 36 (L) 06/02/2022   LDLCALC 93 06/02/2022   TRIG 102 06/02/2022   CHOLHDL 4.1 06/02/2022  The 10-year ASCVD risk score (Arnett DK, et al., 2019) is: 40.6%   Values used to calculate the score:     Age: 71 years     Sex: Male     Is Non-Hispanic African American: No     Diabetic: Yes     Tobacco smoker: No     Systolic Blood Pressure: [id number] mmHg     Is BP treated: Yes     HDL Cholesterol: 36 mg/dL     Total Cholesterol: 148 mg/dL  Goal is LDL  is less than 70 currently on atorvastatin 40 mg daily  Start zetia 10mg  daily.  Pt counseled on alcohol cessation .  follw up in 6 weeks

## 2022-06-08 NOTE — Patient Instructions (Addendum)
Please start taking hydrochlorothiazide 12.5mg  once daily , continue lisinopril 20mg  daily.  Pleas star taking zetia 10mg  daily for your high cholesterol .   Blood pressure goal is less than 130/80.    It is important that you exercise regularly at least 30 minutes 5 times a week.  Think about what you will eat, plan ahead. Choose " clean, green, fresh or frozen" over canned, processed or packaged foods which are more sugary, salty and fatty. 70 to 75% of food eaten should be vegetables and fruit. Three meals at set times with snacks allowed between meals, but they must be fruit or vegetables. Aim to eat over a 12 hour period , example 7 am to 7 pm, and STOP after  your last meal of the day. Drink water,generally about 64 ounces per day, no other drink is as healthy. Fruit juice is best enjoyed in a healthy way, by EATING the fruit.  Thanks for choosing [facility], we consider it a privelige to serve you.

## 2022-06-08 NOTE — Assessment & Plan Note (Addendum)
BP Readings from Last 3 Encounters:  06/08/22 139/87  02/03/22 134/78  01/03/22 (!) 148/98  currently on lisinopril 20mg  dailty , start HCTZ 12.5mg  once daily , BP goal is less than 130/80 DASH diet advises, exercise regularly as tolerated as least 150 minutes weekly  Monitor BP at home , keep a log and bring to next visit in 6 weeks BMP IN 2 weeks

## 2022-06-08 NOTE — Assessment & Plan Note (Addendum)
Lab Results  Component Value Date   HGBA1C 6.5 (H) 06/02/2022  new diagnosis Avoid sugar sweets, will start patient on medication if A1c gets worse after recheck in 6 months. On ACE, statin, has upcoming eye exam

## 2022-06-08 NOTE — Progress Notes (Signed)
Complete physical exam  Patient: Jose Villa   DOB: 08-Jan-1951   71 y.o. Male  MRN: [id number]  Subjective:    Chief Complaint  Patient presents with   Annual Exam    cpe    Jose Villa is a 71 y.o. male with past medical history of hypertension, COPD, loss of hearing, obesity who presents today for a complete physical exam. He reports consuming a lot of vegetables, paying attention to his salt intake.. he does daily walking exercises, walks on the exercise bike 30 minutes daily . He generally feels well. He reports sleeping fairly well.   Wears prescription glasses has upcoming appointment with his eye doctor.   Pt will got  second dose of shingles vaccine today .   HTN, He has stopped  taking amlodipine because it gave him constipation, currently on lisinopril 20 mg daily pt denies chest , sob , edema.   Most recent fall risk assessment:    06/08/2022    3:07 PM  Fall Risk   Falls in the past year? 0  Number falls in past yr: 0  Injury with Fall? 0  Risk for fall due to : No Fall Risks  Follow up Falls evaluation completed     Most recent depression screenings:    06/08/2022    3:07 PM 02/03/2022   11:13 AM  PHQ 2/9 Scores  PHQ - 2 Score 0 0        Patient Care Team: Donell BeersPaseda, Akeia Perot R, FNP as PCP - General (Nurse Practitioner)   Outpatient Medications Prior to Visit  Medication Sig   albuterol (VENTOLIN HFA) 108 (90 Base) MCG/ACT inhaler Inhale 2 puffs into the lungs every 4 (four) hours as needed for wheezing.   atorvastatin (LIPITOR) 40 MG tablet Take 1 tablet (40 mg total) by mouth daily.   Budeson-Glycopyrrol-Formoterol (BREZTRI AEROSPHERE) 160-9-4.8 MCG/ACT AERO Inhale 2 puffs into the lungs in the morning and at bedtime.   cholecalciferol (VITAMIN D3) 25 MCG (1000 UT) tablet Take 1,000 Units by mouth daily.   lisinopril (ZESTRIL) 20 MG tablet Take 1 tablet (20 mg total) by mouth daily.   Omega-3 Fatty Acids (FISH OIL) 1000 MG CAPS Take 1 capsule by  mouth daily.   [DISCONTINUED] amLODipine (NORVASC) 5 MG tablet Take 1 tablet (5 mg total) by mouth daily. (Patient not taking: Reported on 02/03/2022)   No facility-administered medications prior to visit.    Review of Systems  Constitutional: Negative.  Negative for chills and fever.  HENT:  Positive for hearing loss. Negative for ear discharge, ear pain, nosebleeds and tinnitus.   Eyes: Negative.  Negative for pain, discharge and redness.  Respiratory: Negative.  Negative for cough, hemoptysis, sputum production and shortness of breath.   Cardiovascular: Negative.  Negative for chest pain, palpitations, orthopnea and claudication.  Gastrointestinal: Negative.  Negative for abdominal pain, heartburn, nausea and vomiting.  Genitourinary: Negative.  Negative for dysuria, frequency and urgency.  Musculoskeletal: Negative.   Skin: Negative.  Negative for itching and rash.  Neurological: Negative.  Negative for dizziness, tingling, tremors and headaches.  Endo/Heme/Allergies: Negative.  Negative for environmental allergies and polydipsia. Does not bruise/bleed easily.  Psychiatric/Behavioral: Negative.  Negative for hallucinations, substance abuse and suicidal ideas.           Objective:     BP 139/87   Pulse 72   Ht 5\' 11"  (1.803 m)   Wt 238 lb (108 kg)   SpO2 93%   BMI 33.19 kg/m  Physical Exam Constitutional:      General: He is not in acute distress.    Appearance: He is obese. He is not ill-appearing, toxic-appearing or diaphoretic.  HENT:     Head: Normocephalic.     Comments: Wears bilateral hearing aids    Right Ear: Tympanic membrane, ear canal and external ear normal. There is no impacted cerumen.     Left Ear: Tympanic membrane, ear canal and external ear normal. There is no impacted cerumen.     Nose: No congestion or rhinorrhea.     Mouth/Throat:     Mouth: Mucous membranes are moist.     Pharynx: Oropharynx is clear. No oropharyngeal exudate or posterior  oropharyngeal erythema.  Eyes:     General: No scleral icterus.       Right eye: No discharge.        Left eye: No discharge.     Extraocular Movements: Extraocular movements intact.     Conjunctiva/sclera: Conjunctivae normal.     Pupils: Pupils are equal, round, and reactive to light.     Comments: Wears prescription glasses  Neck:     Vascular: No carotid bruit.  Cardiovascular:     Rate and Rhythm: Normal rate and regular rhythm.     Pulses: Normal pulses.     Heart sounds: Normal heart sounds. No murmur heard.    No friction rub. No gallop.  Pulmonary:     Effort: Pulmonary effort is normal. No respiratory distress.     Breath sounds: Normal breath sounds. No stridor. No wheezing, rhonchi or rales.  Chest:     Chest wall: No tenderness.  Abdominal:     General: There is no distension.     Palpations: There is no mass.     Tenderness: There is no abdominal tenderness. There is no right CVA tenderness, left CVA tenderness, guarding or rebound.     Hernia: No hernia is present.  Musculoskeletal:        General: No swelling, tenderness, deformity or signs of injury. Normal range of motion.     Cervical back: Normal range of motion and neck supple. No rigidity or tenderness.     Right lower leg: No edema.     Left lower leg: No edema.  Lymphadenopathy:     Cervical: No cervical adenopathy.  Skin:    General: Skin is warm and dry.     Capillary Refill: Capillary refill takes less than 2 seconds.     Coloration: Skin is not jaundiced or pale.     Findings: No bruising, erythema, lesion or rash.  Neurological:     Mental Status: He is alert and oriented to person, place, and time.     Cranial Nerves: No cranial nerve deficit.     Sensory: No sensory deficit.     Motor: No weakness.     Coordination: Coordination normal.     Gait: Gait normal.     Deep Tendon Reflexes: Reflexes normal.  Psychiatric:        Mood and Affect: Mood normal.        Behavior: Behavior normal.         Thought Content: Thought content normal.        Judgment: Judgment normal.      No results found for any visits on 06/08/22.     Assessment & Plan:    Routine Health Maintenance and Physical Exam  Immunization History  Administered Date(s) Administered   Influenza-Unspecified 01/03/2022  Moderna Covid-19 Vaccine Bivalent Booster 80yrs & up 01/03/2022   Moderna Sars-Covid-2 Vaccination 02/24/2020, 03/26/2020   PNEUMOCOCCAL CONJUGATE-20 01/03/2022   Tdap 03/26/2020   Zoster Recombinat (Shingrix) 01/03/2022, 06/08/2022    Health Maintenance  Topic Date Due   FOOT EXAM  Never done   OPHTHALMOLOGY EXAM  Never done   COLONOSCOPY (Pts 45-38yrs Insurance coverage will need to be confirmed)  08/29/2021   COVID-19 Vaccine (4 - Moderna series) 06/24/2022 (Originally 05/03/2022)   INFLUENZA VACCINE  07/26/2022   HEMOGLOBIN A1C  12/02/2022   TETANUS/TDAP  03/26/2030   Pneumonia Vaccine 32+ Years old  Completed   Hepatitis C Screening  Completed   Zoster Vaccines- Shingrix  Completed   HPV VACCINES  Aged Out    Discussed health benefits of physical activity, and encouraged him to engage in regular exercise appropriate for his age and condition.  Problem List Items Addressed This Visit       Cardiovascular and Mediastinum   Hypertension, essential    BP Readings from Last 3 Encounters:  06/08/22 139/87  02/03/22 134/78  01/03/22 (!) 148/98  currently on lisinopril 20mg  dailty , start HCTZ 12.5mg  once daily , BP goal is less than 130/80 DASH diet advises, exercise regularly as tolerated as least 150 minutes weekly  Monitor BP at home , keep a log and bring to next visit in 6 weeks BMP IN 2 weeks       Relevant Medications   hydrochlorothiazide (HYDRODIURIL) 12.5 MG tablet   ezetimibe (ZETIA) 10 MG tablet   Other Relevant Orders   Basic Metabolic Panel (BMET)     Endocrine   Controlled diabetes mellitus type 2 with complications (HCC)    Lab Results  Component Value  Date   HGBA1C 6.5 (H) 06/02/2022  new diagnosis Avoid sugar sweets, will start patient on medication if A1c gets worse after recheck in 6 months. On ACE, statin, has upcoming eye exam         Other   Hyperlipidemia    Lab Results  Component Value Date   CHOL 148 06/02/2022   HDL 36 (L) 06/02/2022   LDLCALC 93 06/02/2022   TRIG 102 06/02/2022   CHOLHDL 4.1 06/02/2022  The 10-year ASCVD risk score (Arnett DK, et al., 2019) is: 40.6%   Values used to calculate the score:     Age: 46 years     Sex: Male     Is Non-Hispanic African American: No     Diabetic: Yes     Tobacco smoker: No     Systolic Blood Pressure: 139 mmHg     Is BP treated: Yes     HDL Cholesterol: 36 mg/dL     Total Cholesterol: 148 mg/dL  Goal is LDL  is less than 70 currently on atorvastatin 40 mg daily  Start zetia 10mg  daily.  Pt counseled on alcohol cessation .  follw up in 6 weeks       Relevant Medications   hydrochlorothiazide (HYDRODIURIL) 12.5 MG tablet   ezetimibe (ZETIA) 10 MG tablet   Elevated PSA, less than 10 ng/ml    Denies blood in the urine, frequency, hesitancy, pain Lab Results  Component Value Date   PSA1 4.3 (H) 06/02/2022   PSA 1.04 12/15/2011   No need for follow up at this time due to his age  unless symptomatic      Annual physical exam - Primary    Annual exam as documented.  Counseling done include healthy lifestyle involving  committing to 150 minutes of exercise per week, heart healthy diet, and attaining healthy weight. The importance of adequate sleep also discussed.  Regular use of seat belt and home safety were also discussed . Changes in health habits are decided on by patient with goals and time frames set for achieving them. Second dose of shingles vaccine given today, Cologuard ordered        Elevated TSH    Lab Results  Component Value Date   TSH 6.180 (H) 06/02/2022  No complaints of fatigue, low HR, cold intolerance, depression Check TSH T4 in 6 weeks       Need for varicella vaccine    Patient educated on CDC recommendation for the vaccine. Verbal consent was obtained from the patient, vaccine administered by nurse, no sign of adverse reactions noted at this time. Patient education on arm soreness and use of tylenol  for this patient  was discussed. Patient educated on the signs and symptoms of adverse effect and advise to contact the office if they occur.      Relevant Orders   Varicella-zoster vaccine IM (Completed)   Other Visit Diagnoses     Screening for colon cancer       Relevant Orders   Cologuard      Return in about 6 weeks (around 07/20/2022) for HTN/HLD.     Donell Beers, FNP

## 2022-06-08 NOTE — Assessment & Plan Note (Signed)
Lab Results  Component Value Date   TSH 6.180 (H) 06/02/2022  No complaints of fatigue, low HR, cold intolerance, depression Check TSH T4 in 6 weeks

## 2022-06-08 NOTE — Assessment & Plan Note (Signed)
Denies blood in the urine, frequency, hesitancy, pain Lab Results  Component Value Date   PSA1 4.3 (H) 06/02/2022   PSA 1.04 12/15/2011   No need for follow up at this time due to his age  unless symptomatic

## 2022-06-08 NOTE — Assessment & Plan Note (Signed)
Patient educated on CDC recommendation for the vaccine. Verbal consent was obtained from the patient, vaccine administered by nurse, no sign of adverse reactions noted at this time. Patient education on arm soreness and use of tylenol  for this patient  was discussed. Patient educated on the signs and symptoms of adverse effect and advise to contact the office if they occur.  ?

## 2022-06-08 NOTE — Assessment & Plan Note (Signed)
Annual exam as documented.  Counseling done include healthy lifestyle involving committing to 150 minutes of exercise per week, heart healthy diet, and attaining healthy weight. The importance of adequate sleep also discussed.  Regular use of seat belt and home safety were also discussed . Changes in health habits are decided on by patient with goals and time frames set for achieving them. Second dose of shingles vaccine given today, Cologuard ordered

## 2022-06-13 DIAGNOSIS — Z1211 Encounter for screening for malignant neoplasm of colon: Secondary | ICD-10-CM | POA: Diagnosis not present

## 2022-06-20 LAB — COLOGUARD: COLOGUARD: NEGATIVE

## 2022-06-22 DIAGNOSIS — H524 Presbyopia: Secondary | ICD-10-CM | POA: Diagnosis not present

## 2022-06-29 DIAGNOSIS — I10 Essential (primary) hypertension: Secondary | ICD-10-CM | POA: Diagnosis not present

## 2022-06-29 DIAGNOSIS — R7989 Other specified abnormal findings of blood chemistry: Secondary | ICD-10-CM | POA: Diagnosis not present

## 2022-06-30 LAB — BASIC METABOLIC PANEL
BUN/Creatinine Ratio: 23 (ref 10–24)
BUN: 23 mg/dL (ref 8–27)
CO2: 27 mmol/L (ref 20–29)
Calcium: 10.2 mg/dL (ref 8.6–10.2)
Chloride: 98 mmol/L (ref 96–106)
Creatinine, Ser: 0.99 mg/dL (ref 0.76–1.27)
Glucose: 106 mg/dL — ABNORMAL HIGH (ref 70–99)
Potassium: 4.6 mmol/L (ref 3.5–5.2)
Sodium: 139 mmol/L (ref 134–144)
eGFR: 82 mL/min/{1.73_m2} (ref >59)

## 2022-06-30 NOTE — Progress Notes (Signed)
Normal electrolytes

## 2022-07-21 ENCOUNTER — Ambulatory Visit (INDEPENDENT_AMBULATORY_CARE_PROVIDER_SITE_OTHER): Payer: Medicare Other | Admitting: Nurse Practitioner

## 2022-07-21 ENCOUNTER — Encounter: Payer: Self-pay | Admitting: Nurse Practitioner

## 2022-07-21 VITALS — BP 114/67 | HR 85 | Ht 70.0 in | Wt 240.0 lb

## 2022-07-21 DIAGNOSIS — E782 Mixed hyperlipidemia: Secondary | ICD-10-CM | POA: Diagnosis not present

## 2022-07-21 DIAGNOSIS — R972 Elevated prostate specific antigen [PSA]: Secondary | ICD-10-CM

## 2022-07-21 DIAGNOSIS — I10 Essential (primary) hypertension: Secondary | ICD-10-CM | POA: Diagnosis not present

## 2022-07-21 DIAGNOSIS — R7989 Other specified abnormal findings of blood chemistry: Secondary | ICD-10-CM | POA: Diagnosis not present

## 2022-07-21 DIAGNOSIS — E118 Type 2 diabetes mellitus with unspecified complications: Secondary | ICD-10-CM | POA: Diagnosis not present

## 2022-07-21 NOTE — Addendum Note (Signed)
Addended by: Donell Beers on: 07/21/2022 07:45 PM   Modules accepted: Orders

## 2022-07-21 NOTE — Progress Notes (Addendum)
Jose Villa     MRN: [id number]      DOB: [birth year]   HPI Jose Villa past medical history of hypertension, type 2 diabetes, hyperlipidemia, elevated TSH COPD is here for follow up for hypertension and hyperlipidemia and elevated TSH   Currently on lisinopril 20 mg daily, hydrochlorothiazide 12.5 mg daily patient denies headache, dizziness, edema.  Elevated TSH, noted on recent labs.  Patient denies palpitation, cold intolerance, insomnia.  Hyperlipidemia.  Currently on atorvastatin 40 mg daily, Zetia 10 mg daily.  Patient states that he drinks 2 cans of beer daily.  Need to avoid drinking while on atorvastatin discussed with patient.    The PT denies any adverse reactions to current medications since the last visit.  There are no new concerns.  There are no specific complaints     ROS Denies recent fever or chills. Denies sinus pressure, nasal congestion, ear pain or sore throat. Denies chest congestion, productive cough or wheezing. Denies chest pains, palpitations and leg swelling Denies abdominal pain, nausea, vomiting,diarrhea or constipation.   Denies dysuria, frequency, hesitancy or incontinence. Denies joint pain, swelling and limitation in mobility. Denies depression, anxiety or insomnia.    PE  BP 114/67 (BP Location: Right Arm, Patient Position: Sitting, Cuff Size: Normal)   Pulse 85   Ht _0  (1.778 m)   Wt 240 lb (108.9 kg)   SpO2 90%   BMI 34.44 kg/m   Patient alert and oriented and in no cardiopulmonary distress.  Chest: Clear to auscultation bilaterally.  CVS: S1, S2 no murmurs, no S3.Regular rate.  ABD: Soft non tender.   Ext: No edema  MS: Adequate ROM spine, shoulders, hips and knees.  Psych: Good eye contact, normal affect. Memory intact not anxious or depressed appearing.  CNS: CN 2-12 intact, power,  normal throughout.no focal deficits noted.   Assessment & Plan Hypertension, essential BP Readings from Last 3 Encounters:  07/21/22  114/67  06/08/22 139/87  02/03/22 134/78  Chronic condition well-controlled on lisinopril 20 mg daily, hydrochlorothiazide 12.5 mg daily Continue current medication DASH diet advised engage in regular daily exercises at least 150 minutes weekly Lab Results  Component Value Date   NA 139 06/29/2022   K 4.6 06/29/2022   CO2 27 06/29/2022   GLUCOSE 106 (H) 06/29/2022   BUN 23 06/29/2022   CREATININE 0.99 06/29/2022   CALCIUM 10.2 06/29/2022   EGFR 82 06/29/2022   GFRNONAA 87 02/12/2021    Controlled diabetes mellitus type 2 with complications ([facility]) Condition  Diet controled  Foot exam comleted today  Had eye exam last month, results of eye exam requested today Avoid sugar sweets soda engage in regular moderate exercises at least 150 minutes weekly Urine creatinine albumin labs ordered  Hyperlipidemia LDL goal is less than 70 Currently on atorvastatin 40 mg daily, Zetia 10 mg daily Check lipid panel Avoid fatty fried foods Lab Results  Component Value Date   CHOL 148 06/02/2022   HDL 36 (L) 06/02/2022   LDLCALC 93 06/02/2022   TRIG 102 06/02/2022   CHOLHDL 4.1 06/02/2022    Elevated TSH TSH, T4 today Patient denies signs of hypothyroidism today Lab Results  Component Value Date   TSH 6.180 (H) 06/02/2022    Elevated PSA, less than 10 ng/ml Lab Results  Component Value Date   PSA1 4.3 (H) 06/02/2022   PSA 1.04 12/15/2011   Patient denies urinary retention, bloody urine, hesitancy, frequency, pelvic pain.incontinence Will refer to urology to rule out prostate  cancer

## 2022-07-21 NOTE — Assessment & Plan Note (Signed)
Lab Results  Component Value Date   PSA1 4.3 (H) 06/02/2022   PSA 1.04 12/15/2011   Patient denies urinary retention, bloody urine, hesitancy, frequency, pelvic pain.incontinence Will refer to urology to rule out prostate cancer

## 2022-07-21 NOTE — Assessment & Plan Note (Signed)
LDL goal is less than 70 Currently on atorvastatin 40 mg daily, Zetia 10 mg daily Check lipid panel Avoid fatty fried foods Lab Results  Component Value Date   CHOL 148 06/02/2022   HDL 36 (L) 06/02/2022   LDLCALC 93 06/02/2022   TRIG 102 06/02/2022   CHOLHDL 4.1 06/02/2022

## 2022-07-21 NOTE — Assessment & Plan Note (Addendum)
BP Readings from Last 3 Encounters:  07/21/22 114/67  06/08/22 139/87  02/03/22 134/78  Chronic condition well-controlled on lisinopril 20 mg daily, hydrochlorothiazide 12.5 mg daily Continue current medication DASH diet advised engage in regular daily exercises at least 150 minutes weekly Lab Results  Component Value Date   NA 139 06/29/2022   K 4.6 06/29/2022   CO2 27 06/29/2022   GLUCOSE 106 (H) 06/29/2022   BUN 23 06/29/2022   CREATININE 0.99 06/29/2022   CALCIUM 10.2 06/29/2022   EGFR 82 06/29/2022   GFRNONAA 87 02/12/2021

## 2022-07-21 NOTE — Patient Instructions (Addendum)
Please get your fasting labs done as dicussed     It is important that you exercise regularly at least 30 minutes 5 times a week.  Think about what you will eat, plan ahead. Choose " clean, green, fresh or frozen" over canned, processed or packaged foods which are more sugary, salty and fatty. 70 to 75% of food eaten should be vegetables and fruit. Three meals at set times with snacks allowed between meals, but they must be fruit or vegetables. Aim to eat over a 12 hour period , example 7 am to 7 pm, and STOP after  your last meal of the day. Drink water,generally about 64 ounces per day, no other drink is as healthy. Fruit juice is best enjoyed in a healthy way, by EATING the fruit.  Thanks for choosing [facility], we consider it a privelige to serve you.

## 2022-07-21 NOTE — Assessment & Plan Note (Addendum)
Condition  Diet controled  Foot exam comleted today  Had eye exam last month, results of eye exam requested today Avoid sugar sweets soda engage in regular moderate exercises at least 150 minutes weekly Urine creatinine albumin labs ordered

## 2022-07-21 NOTE — Assessment & Plan Note (Signed)
TSH, T4 today Patient denies signs of hypothyroidism today Lab Results  Component Value Date   TSH 6.180 (H) 06/02/2022

## 2022-08-09 ENCOUNTER — Other Ambulatory Visit: Payer: Self-pay | Admitting: Nurse Practitioner

## 2022-08-09 DIAGNOSIS — I10 Essential (primary) hypertension: Secondary | ICD-10-CM

## 2022-08-16 ENCOUNTER — Other Ambulatory Visit: Payer: Self-pay

## 2022-08-16 MED ORDER — ALBUTEROL SULFATE HFA 108 (90 BASE) MCG/ACT IN AERS: 2.0000 | INHALATION_SPRAY | RESPIRATORY_TRACT | 3 refills | Status: DC | PRN

## 2022-08-18 ENCOUNTER — Encounter: Payer: Self-pay | Admitting: Urology

## 2022-08-18 ENCOUNTER — Ambulatory Visit: Payer: Medicare Other | Admitting: Urology

## 2022-08-18 VITALS — BP 125/75 | HR 67

## 2022-08-18 DIAGNOSIS — R972 Elevated prostate specific antigen [PSA]: Secondary | ICD-10-CM | POA: Diagnosis not present

## 2022-08-18 NOTE — Progress Notes (Signed)
Subjective: 1. Elevated PSA      Consult requested by Edwin Dada NP.   Jose Villa is a 71 yo male who is sent for an elevated PSA of 4.3 on 06/02/22 which is up from 1.04 in 12/15/11.  His IPSS is 2 with nocturia x 2.  The nocturia depends on his intake.  He has had no UTI's or stones.   He has had no GU surgery.  He was unable to get a urine today.    ROS:  Review of Systems  All other systems reviewed and are negative.   Allergies  Allergen Reactions   Coconut Oil Fatty Acid Diethanolamide [Cocamide Dea] Hives    Past Medical History:  Diagnosis Date   Closed fracture of lateral malleolus 12/02/2010   Qualifier: Diagnosis of  By: Romeo Apple MD, Duffy Rhody     COPD (chronic obstructive pulmonary disease) (HCC)    Hearing loss, central     Past Surgical History:  Procedure Laterality Date   FRACTURE SURGERY     right ankle    Social History   Socioeconomic History   Marital status: Single    Spouse name: Not on file   Number of children: 1   Years of education: Not on file   Highest education level: Not on file  Occupational History   Occupation: retired  Tobacco Use   Smoking status: Former    Packs/day: 1.00    Types: Cigarettes    Quit date: 12/26/2016    Years since quitting: 5.6   Smokeless tobacco: Never  Vaping Use   Vaping Use: Never used  Substance and Sexual Activity   Alcohol use: Not Currently    Comment: 2 beers per day; one 12 pack per week   Drug use: No   Sexual activity: Not Currently  Other Topics Concern   Not on file  Social History Narrative   Lives home alone.    Social Determinants of Health   Financial Resource Strain: Low Risk  (08/06/2021)   Overall Financial Resource Strain (CARDIA)    Difficulty of Paying Living Expenses: Not hard at all  Food Insecurity: No Food Insecurity (08/06/2021)   Hunger Vital Sign    Worried About Running Out of Food in the Last Year: Never true    Ran Out of Food in the Last Year: Never true   Transportation Needs: No Transportation Needs (08/06/2021)   PRAPARE - Administrator, Civil Service (Medical): No    Lack of Transportation (Non-Medical): No  Physical Activity: Sufficiently Active (08/06/2021)   Exercise Vital Sign    Days of Exercise per Week: 5 days    Minutes of Exercise per Session: 30 min  Stress: No Stress Concern Present (08/06/2021)   [employer] of Occupational Health - Occupational Stress Questionnaire    Feeling of Stress : Not at all  Social Connections: Moderately Isolated (08/06/2021)   Social Connection and Isolation Panel [NHANES]    Frequency of Communication with Friends and Family: More than three times a week    Frequency of Social Gatherings with Friends and Family: More than three times a week    Attends Religious Services: 1 to 4 times per year    Active Member of [employer] or Organizations: No    Attends Banker Meetings: Never    Marital Status: Divorced  Catering manager Violence: Not At Risk (08/06/2021)   Humiliation, Afraid, Rape, and Kick questionnaire    Fear of Current or Ex-Partner: No  Emotionally Abused: No    Physically Abused: No    Sexually Abused: No    Family History  Problem Relation Age of Onset   Cancer Mother    Asthma Sister    Breast cancer Sister     Anti-infectives: Anti-infectives (From admission, onward)    None       Current Outpatient Medications  Medication Sig Dispense Refill   albuterol (VENTOLIN HFA) 108 (90 Base) MCG/ACT inhaler Inhale 2 puffs into the lungs every 4 (four) hours as needed for wheezing. 1 each 3   atorvastatin (LIPITOR) 40 MG tablet Take 1 tablet (40 mg total) by mouth daily. 90 tablet 3   Budeson-Glycopyrrol-Formoterol (BREZTRI AEROSPHERE) 160-9-4.8 MCG/ACT AERO Inhale 2 puffs into the lungs in the morning and at bedtime. 10.7 g 3   cholecalciferol (VITAMIN D3) 25 MCG (1000 UT) tablet Take 1,000 Units by mouth daily.     ezetimibe (ZETIA) 10 MG tablet  Take 1 tablet (10 mg total) by mouth daily. 90 tablet 3   hydrochlorothiazide (HYDRODIURIL) 12.5 MG tablet TAKE 1 TABLET(12.5 MG) BY MOUTH DAILY 30 tablet 1   lisinopril (ZESTRIL) 20 MG tablet Take 1 tablet (20 mg total) by mouth daily. 90 tablet 3   Omega-3 Fatty Acids (FISH OIL) 1000 MG CAPS Take 1 capsule by mouth daily.     UNABLE TO FIND Blood pressure cuff DX: I10 1 each 0   No current facility-administered medications for this visit.     Objective: Vital signs in last 24 hours: BP 125/75   Pulse 67   Intake/Output from previous day: No intake/output data recorded. Intake/Output this shift: @IOTHISSHIFT @   Physical Exam Vitals reviewed.  Constitutional:      Appearance: Normal appearance. He is obese.  Cardiovascular:     Rate and Rhythm: Normal rate and regular rhythm.     Heart sounds: Normal heart sounds.  Pulmonary:     Effort: Pulmonary effort is normal.     Breath sounds: Normal breath sounds.  Abdominal:     Palpations: Abdomen is soft.     Hernia: A hernia (umbilical) is present.     Comments: obese  Genitourinary:    Comments: Nl uncirc phallus with an adequate meatus.  Scrotum, testes and epididymis nl. AP without lesions. Prostate 2+ benign but high riding. SV non-palpable.  Musculoskeletal:        General: No swelling or tenderness. Normal range of motion.     Cervical back: Normal range of motion.  Skin:    General: Skin is warm and dry.  Neurological:     General: No focal deficit present.     Mental Status: He is alert and oriented to person, place, and time.  Psychiatric:        Mood and Affect: Mood normal.        Behavior: Behavior normal.     Lab Results:  No results found for this or any previous visit (from the past 24 hour(s)).  BMET No results for input(s): "NA", "K", "CL", "CO2", "GLUCOSE", "BUN", "CREATININE", "CALCIUM" in the last 72 hours. PT/INR No results for input(s): "LABPROT", "INR" in the last 72 hours. ABG No  results for input(s): "PHART", "HCO3" in the last 72 hours.  Invalid input(s): "PCO2", "PO2" PSA reviewed.   Studies/Results: No results found.   Assessment/Plan: Elevated PSA.   His PSA is slightly elevated at 4.3 but it was only 1.04 in 2012.  His exam is benign.  I will repeat a PSA  with f/t ratio today and if that is up further , I will consider and MRI of the prostate.  If the PSA is down, then I will have him return in 6 months with a PSA.   No orders of the defined types were placed in this encounter.    Orders Placed This Encounter  Procedures   Urinalysis, Routine w reflex microscopic   PSA, total and free    Standing Status:   Future    Standing Expiration Date:   08/19/2023   PSA, total and free     Return in about 6 months (around 02/18/2023) for with PSA.    CC: Edwin Dada FNP.      Jose Villa 08/18/2022 [phone]

## 2022-08-19 LAB — PSA, TOTAL AND FREE
PSA, Free Pct: 20 %
PSA, Free: 0.98 ng/mL
Prostate Specific Ag, Serum: 4.9 ng/mL — ABNORMAL HIGH (ref 0.0–4.0)

## 2022-09-08 ENCOUNTER — Other Ambulatory Visit: Payer: Self-pay | Admitting: Nurse Practitioner

## 2022-09-08 DIAGNOSIS — J449 Chronic obstructive pulmonary disease, unspecified: Secondary | ICD-10-CM

## 2022-10-08 ENCOUNTER — Other Ambulatory Visit: Payer: Self-pay | Admitting: Nurse Practitioner

## 2022-10-08 DIAGNOSIS — I10 Essential (primary) hypertension: Secondary | ICD-10-CM

## 2022-10-10 ENCOUNTER — Other Ambulatory Visit: Payer: Self-pay | Admitting: Internal Medicine

## 2022-10-10 DIAGNOSIS — I10 Essential (primary) hypertension: Secondary | ICD-10-CM

## 2023-01-04 ENCOUNTER — Other Ambulatory Visit: Payer: Self-pay | Admitting: Internal Medicine

## 2023-01-04 DIAGNOSIS — I10 Essential (primary) hypertension: Secondary | ICD-10-CM

## 2023-01-09 DIAGNOSIS — K080 Exfoliation of teeth due to systemic causes: Secondary | ICD-10-CM | POA: Diagnosis not present

## 2023-01-20 ENCOUNTER — Ambulatory Visit (INDEPENDENT_AMBULATORY_CARE_PROVIDER_SITE_OTHER): Payer: Medicare Other | Admitting: Internal Medicine

## 2023-01-20 ENCOUNTER — Encounter: Payer: Self-pay | Admitting: Internal Medicine

## 2023-01-20 ENCOUNTER — Ambulatory Visit: Payer: Medicare Other | Admitting: Nurse Practitioner

## 2023-01-20 VITALS — BP 115/72 | HR 85 | Ht 71.0 in | Wt 243.4 lb

## 2023-01-20 DIAGNOSIS — R7989 Other specified abnormal findings of blood chemistry: Secondary | ICD-10-CM | POA: Diagnosis not present

## 2023-01-20 DIAGNOSIS — E782 Mixed hyperlipidemia: Secondary | ICD-10-CM | POA: Diagnosis not present

## 2023-01-20 DIAGNOSIS — Z0001 Encounter for general adult medical examination with abnormal findings: Secondary | ICD-10-CM

## 2023-01-20 DIAGNOSIS — J449 Chronic obstructive pulmonary disease, unspecified: Secondary | ICD-10-CM

## 2023-01-20 DIAGNOSIS — E118 Type 2 diabetes mellitus with unspecified complications: Secondary | ICD-10-CM

## 2023-01-20 DIAGNOSIS — I10 Essential (primary) hypertension: Secondary | ICD-10-CM

## 2023-01-20 MED ORDER — ALBUTEROL SULFATE HFA 108 (90 BASE) MCG/ACT IN AERS: 2.0000 | INHALATION_SPRAY | RESPIRATORY_TRACT | 3 refills | Status: DC | PRN

## 2023-01-20 MED ORDER — LISINOPRIL-HYDROCHLOROTHIAZIDE 20-12.5 MG PO TABS: 1.0000 | ORAL_TABLET | Freq: Every day | ORAL | 3 refills | Status: DC

## 2023-01-20 NOTE — Progress Notes (Signed)
Established Patient Office Visit  Subjective   Patient ID: Jose Villa, male    DOB: 1951/11/24  Age: 72 y.o. MRN: [id number]  Chief Complaint  Patient presents with   Hyperlipidemia    Follow up   Hypertension    Follow up   Diabetes    Follow up   Jose Villa returns to care today.  He was last seen at [facility] on 07/21/22 by Vena Rua, NP.  No medication changes were made at that time and 52-month follow-up was arranged.  In the interim he has been seen by urology for follow-up in the setting of an elevated PSA.  Jose Villa reports feeling well today.  He is asymptomatic and his only concern is requesting to review his medications to see which medications can be combined.  Acute concerns, chronic medical concerns, and outstanding preventative care items discussed today are individually addressed in A/P below.  Past Medical History:  Diagnosis Date   Closed fracture of lateral malleolus 12/02/2010   Qualifier: Diagnosis of  By: Aline Brochure MD, Dorothyann Peng     COPD (chronic obstructive pulmonary disease) ([place])    Hearing loss, central    Past Surgical History:  Procedure Laterality Date   FRACTURE SURGERY     right ankle   Social History   Tobacco Use   Smoking status: Former    Packs/day: 1.00    Types: Cigarettes    Quit date: 12/26/2016    Years since quitting: 6.0   Smokeless tobacco: Never  Vaping Use   Vaping Use: Never used  Substance Use Topics   Alcohol use: Not Currently    Comment: 2 beers per day; one 12 pack per week   Drug use: No   Family History  Problem Relation Age of Onset   Cancer Mother    Asthma Sister    Breast cancer Sister    Allergies  Allergen Reactions   Coconut Oil Fatty Acid Diethanolamide [Cocamide Dea] Hives   Review of Systems  Constitutional:  Negative for chills and fever.  HENT:  Negative for sore throat.   Respiratory:  Negative for cough and shortness of breath.   Cardiovascular:  Negative for chest pain, palpitations and leg  swelling.  Gastrointestinal:  Negative for abdominal pain, blood in stool, constipation, diarrhea, nausea and vomiting.  Genitourinary:  Negative for dysuria and hematuria.  Musculoskeletal:  Negative for myalgias.  Skin:  Negative for itching and rash.  Neurological:  Negative for dizziness and headaches.  Psychiatric/Behavioral:  Negative for depression and suicidal ideas.      Objective:     BP 115/72   Pulse 85   Ht 5\' 11"  (1.803 m)   Wt 243 lb 6.4 oz (110.4 kg)   SpO2 90%   BMI 33.95 kg/m  BP Readings from Last 3 Encounters:  01/20/23 115/72  08/18/22 125/75  07/21/22 114/67   Physical Exam Vitals reviewed.  Constitutional:      General: He is not in acute distress.    Appearance: Normal appearance. He is obese. He is not ill-appearing.  HENT:     Head: Normocephalic and atraumatic.     Right Ear: External ear normal.     Left Ear: External ear normal.     Nose: Nose normal. No congestion or rhinorrhea.     Mouth/Throat:     Mouth: Mucous membranes are moist.     Pharynx: Oropharynx is clear.  Eyes:     General: No scleral icterus.  Extraocular Movements: Extraocular movements intact.     Conjunctiva/sclera: Conjunctivae normal.     Pupils: Pupils are equal, round, and reactive to light.  Cardiovascular:     Rate and Rhythm: Normal rate and regular rhythm.     Pulses: Normal pulses.     Heart sounds: Normal heart sounds. No murmur heard. Pulmonary:     Effort: Pulmonary effort is normal.     Breath sounds: Normal breath sounds. No wheezing, rhonchi or rales.  Abdominal:     General: Abdomen is flat. Bowel sounds are normal. There is no distension.     Palpations: Abdomen is soft.     Tenderness: There is no abdominal tenderness.  Musculoskeletal:        General: No swelling or deformity. Normal range of motion.     Cervical back: Normal range of motion.  Skin:    General: Skin is warm and dry.     Capillary Refill: Capillary refill takes less than 2  seconds.  Neurological:     General: No focal deficit present.     Mental Status: He is alert and oriented to person, place, and time.     Motor: No weakness.  Psychiatric:        Mood and Affect: Mood normal.        Behavior: Behavior normal.        Thought Content: Thought content normal.   Last CBC Lab Results  Component Value Date   WBC 10.8 01/20/2023   HGB 15.9 01/20/2023   HCT 48.4 01/20/2023   MCV 99 (H) 01/20/2023   MCH 32.4 01/20/2023   RDW 12.5 01/20/2023   PLT 237 01/20/2023   Last metabolic panel Lab Results  Component Value Date   GLUCOSE 83 01/20/2023   NA 138 01/20/2023   K 4.5 01/20/2023   CL 96 01/20/2023   CO2 25 01/20/2023   BUN 20 01/20/2023   CREATININE 1.05 01/20/2023   EGFR 76 01/20/2023   CALCIUM 10.5 (H) 01/20/2023   PROT 7.3 01/20/2023   ALBUMIN 4.8 01/20/2023   LABGLOB 2.5 01/20/2023   AGRATIO 1.9 01/20/2023   BILITOT 0.5 01/20/2023   ALKPHOS 88 01/20/2023   AST 24 01/20/2023   ALT 23 01/20/2023   ANIONGAP 11 02/13/2019   Last lipids Lab Results  Component Value Date   CHOL 119 01/20/2023   HDL 35 (L) 01/20/2023   LDLCALC 55 01/20/2023   TRIG 173 (H) 01/20/2023   CHOLHDL 3.4 01/20/2023   Last hemoglobin A1c Lab Results  Component Value Date   HGBA1C 6.4 (H) 01/20/2023   Last thyroid functions Lab Results  Component Value Date   TSH 4.250 01/20/2023   Last vitamin D Lab Results  Component Value Date   VD25OH 34.3 06/02/2022     Assessment & Plan:   Problem List Items Addressed This Visit       Hypertension, essential - Primary    He is currently prescribed lisinopril 20 mg daily and HCTZ 12.5 mg daily for treatment of hypertension.  His blood pressure today is 115/72.  He is interested in combining his medications. -I have prescribed lisinopril-HCTZ 20-12.5 mg daily.  He was instructed to stop taking individual lisinopril and HCTZ tablets when he starts the combination pill.      COPD (chronic obstructive  pulmonary disease) (HCC)    Asymptomatic currently.  Unremarkable pulmonary exam today.  He is currently prescribed Breztri.  Does not frequently use his albuterol inhaler. -No changes today  Controlled diabetes mellitus type 2 with complications (HCC)    Diet controlled.  Last A1c 6.5 in June 2023. -Repeat A1c and urine microalbumin ordered today -Followed by ophthalmology, states that he was last seen 4-5 months ago.  Will request records.      Hyperlipidemia    Currently prescribed atorvastatin 40 mg daily, ezetimibe 10 mg daily, and fish oil supplementation.  His last lipid panel was updated in June 2023.  Total cholesterol 148 and LDL 93. -Repeat lipid panel ordered today      Return in about 6 months (around 07/21/2023).   Johnette Abraham, MD

## 2023-01-20 NOTE — Patient Instructions (Signed)
It was a pleasure to see you today.  Thank you for giving [place] the opportunity to be involved in your care.  Below is a brief recap of your visit and next steps.  We will plan to see you again in 6 months.  Summary STOP: lisinopril 20 mg daily and HCTZ 12.5 mg daily  START: lisinopril-HCTZ 20-12.5 mg daily  Repeat labs ordered today We will follow up in 6 months

## 2023-01-21 ENCOUNTER — Other Ambulatory Visit: Payer: Self-pay | Admitting: Nurse Practitioner

## 2023-01-21 LAB — CBC WITH DIFFERENTIAL/PLATELET
Basophils Absolute: 0.1 10*3/uL (ref 0.0–0.2)
Basos: 1 %
EOS (ABSOLUTE): 0.4 10*3/uL (ref 0.0–0.4)
Eos: 3 %
Hematocrit: 48.4 % (ref 37.5–51.0)
Hemoglobin: 15.9 g/dL (ref 13.0–17.7)
Immature Grans (Abs): 0.1 10*3/uL (ref 0.0–0.1)
Immature Granulocytes: 1 %
Lymphocytes Absolute: 2.7 10*3/uL (ref 0.7–3.1)
Lymphs: 25 %
MCH: 32.4 pg (ref 26.6–33.0)
MCHC: 32.9 g/dL (ref 31.5–35.7)
MCV: 99 fL — ABNORMAL HIGH (ref 79–97)
Monocytes Absolute: 1.2 10*3/uL — ABNORMAL HIGH (ref 0.1–0.9)
Monocytes: 11 %
Neutrophils Absolute: 6.3 10*3/uL (ref 1.4–7.0)
Neutrophils: 59 %
Platelets: 237 10*3/uL (ref 150–450)
RBC: 4.91 x10E6/uL (ref 4.14–5.80)
RDW: 12.5 % (ref 11.6–15.4)
WBC: 10.8 10*3/uL (ref 3.4–10.8)

## 2023-01-21 LAB — CMP14+EGFR
ALT: 23 IU/L (ref 0–44)
AST: 24 IU/L (ref 0–40)
Albumin/Globulin Ratio: 1.9 (ref 1.2–2.2)
Albumin: 4.8 g/dL (ref 3.9–4.9)
Alkaline Phosphatase: 88 IU/L (ref 44–121)
BUN/Creatinine Ratio: 19 (ref 10–24)
BUN: 20 mg/dL (ref 8–27)
Bilirubin Total: 0.5 mg/dL (ref 0.0–1.2)
CO2: 25 mmol/L (ref 20–29)
Calcium: 10.5 mg/dL — ABNORMAL HIGH (ref 8.6–10.2)
Chloride: 96 mmol/L (ref 96–106)
Creatinine, Ser: 1.05 mg/dL (ref 0.76–1.27)
Globulin, Total: 2.5 g/dL (ref 1.5–4.5)
Glucose: 83 mg/dL (ref 70–99)
Potassium: 4.5 mmol/L (ref 3.5–5.2)
Sodium: 138 mmol/L (ref 134–144)
Total Protein: 7.3 g/dL (ref 6.0–8.5)
eGFR: 76 mL/min/{1.73_m2} (ref >59)

## 2023-01-21 LAB — TSH+FREE T4
Free T4: 1.08 ng/dL (ref 0.82–1.77)
TSH: 4.25 u[IU]/mL (ref 0.450–4.500)

## 2023-01-21 LAB — LIPID PANEL
Chol/HDL Ratio: 3.4 ratio (ref 0.0–5.0)
Cholesterol, Total: 119 mg/dL (ref 100–199)
HDL: 35 mg/dL — ABNORMAL LOW (ref >39)
LDL Chol Calc (NIH): 55 mg/dL (ref 0–99)
Triglycerides: 173 mg/dL — ABNORMAL HIGH (ref 0–149)
VLDL Cholesterol Cal: 29 mg/dL (ref 5–40)

## 2023-01-21 LAB — HEMOGLOBIN A1C
Est. average glucose Bld gHb Est-mCnc: 137 mg/dL
Hgb A1c MFr Bld: 6.4 % — ABNORMAL HIGH (ref 4.8–5.6)

## 2023-01-23 NOTE — Assessment & Plan Note (Signed)
Asymptomatic currently.  Unremarkable pulmonary exam today.  He is currently prescribed Breztri.  Does not frequently use his albuterol inhaler. -No changes today

## 2023-01-23 NOTE — Assessment & Plan Note (Signed)
Diet controlled.  Last A1c 6.5 in June 2023. -Repeat A1c and urine microalbumin ordered today -Followed by ophthalmology, states that he was last seen 4-5 months ago.  Will request records.

## 2023-01-23 NOTE — Assessment & Plan Note (Signed)
Currently prescribed atorvastatin 40 mg daily, ezetimibe 10 mg daily, and fish oil supplementation.  His last lipid panel was updated in June 2023.  Total cholesterol 148 and LDL 93. -Repeat lipid panel ordered today

## 2023-01-23 NOTE — Assessment & Plan Note (Signed)
He is currently prescribed lisinopril 20 mg daily and HCTZ 12.5 mg daily for treatment of hypertension.  His blood pressure today is 115/72.  He is interested in combining his medications. -I have prescribed lisinopril-HCTZ 20-12.5 mg daily.  He was instructed to stop taking individual lisinopril and HCTZ tablets when he starts the combination pill.

## 2023-02-03 ENCOUNTER — Other Ambulatory Visit: Payer: Self-pay | Admitting: Nurse Practitioner

## 2023-02-03 DIAGNOSIS — J449 Chronic obstructive pulmonary disease, unspecified: Secondary | ICD-10-CM

## 2023-02-07 ENCOUNTER — Telehealth: Payer: Self-pay | Admitting: Internal Medicine

## 2023-02-07 DIAGNOSIS — J449 Chronic obstructive pulmonary disease, unspecified: Secondary | ICD-10-CM

## 2023-02-07 MED ORDER — BREZTRI AEROSPHERE 160-9-4.8 MCG/ACT IN AERO: 2.0000 | INHALATION_SPRAY | Freq: Two times a day (BID) | RESPIRATORY_TRACT | 3 refills | Status: DC

## 2023-02-07 MED ORDER — ATORVASTATIN CALCIUM 40 MG PO TABS: 40.0000 mg | ORAL_TABLET | Freq: Every day | ORAL | 3 refills | Status: DC

## 2023-02-07 NOTE — Telephone Encounter (Signed)
Refills sent for Breztri and atorvastatin.

## 2023-02-07 NOTE — Telephone Encounter (Signed)
Prescription Request  02/07/2023  Is this a "Controlled Substance" medicine? No  LOV: 01/20/2023  What is the name of the medication or equipment?  atorvastatin (LIPITOR) 40 MG tablet [id number]   BREZTRI AEROSPHERE 160-9-4.8 MCG/ACT AERO [  Have you contacted your pharmacy to request a refill? No   Which pharmacy would you like this sent to?  [place]   Patient notified that their request is being sent to the clinical staff for review and that they should receive a response within 2 business days.   Please advise at Mobile [phone] (mobile)

## 2023-02-07 NOTE — Addendum Note (Signed)
Addended by: Marland Kitchen E on: 02/07/2023 12:47 PM   Modules accepted: Orders

## 2023-02-09 ENCOUNTER — Other Ambulatory Visit: Payer: Medicare Other

## 2023-02-16 ENCOUNTER — Ambulatory Visit: Payer: Medicare Other | Admitting: Urology

## 2023-02-27 ENCOUNTER — Encounter: Payer: Self-pay | Admitting: Internal Medicine

## 2023-02-27 ENCOUNTER — Ambulatory Visit (INDEPENDENT_AMBULATORY_CARE_PROVIDER_SITE_OTHER): Payer: Medicare Other | Admitting: Internal Medicine

## 2023-02-27 DIAGNOSIS — Z0000 Encounter for general adult medical examination without abnormal findings: Secondary | ICD-10-CM | POA: Diagnosis not present

## 2023-02-27 NOTE — Patient Instructions (Addendum)
  Mr. Regen , Thank you for taking time to come for your Medicare Wellness Visit. I appreciate your ongoing commitment to your health goals. Please review the following plan we discussed and let me know if I can assist you in the future.   These are the goals we discussed:Patient would like to lose some weight. Counseled on diet and exercise. He will follow up in June to discuss Ozempic. He will also discuss obtaining low dose CT Chest in June. He does not want to schedule today because he will be traveling soon.     This is a list of the screening recommended for you and due dates:  Health Maintenance  Topic Date Due   Eye exam for diabetics  Never done   Yearly kidney health urinalysis for diabetes  Never done   Medicare Annual Wellness Visit  08/06/2022   Flu Shot  03/26/2023*   Hemoglobin A1C  07/21/2023   Complete foot exam   07/22/2023   Yearly kidney function blood test for diabetes  01/21/2024   Cologuard (Stool DNA test)  06/13/2025   DTaP/Tdap/Td vaccine (2 - Td or Tdap) 03/26/2030   Pneumonia Vaccine  Completed   Hepatitis C Screening: USPSTF Recommendation to screen - Ages 42-79 yo.  Completed   Zoster (Shingles) Vaccine  Completed   HPV Vaccine  Aged Out   Colon Cancer Screening  Discontinued   COVID-19 Vaccine  Discontinued  *Topic was postponed. The date shown is not the original due date.

## 2023-02-27 NOTE — Progress Notes (Signed)
Subjective:  This is a telephone encounter between Jose Villa and Jose Villa on 02/27/2023 for AWV. The visit was conducted with the patient located at home and Jose Villa at [facility]. The patient's identity was confirmed using their DOB and current address. The patient has consented to being evaluated through a telephone encounter and understands the associated risks (an examination cannot be done and the patient may need to come in for an appointment) / benefits (allows the patient to remain at home, decreasing exposure to coronavirus).     Jose Villa is a 72 y.o. male who presents for Medicare Annual/Subsequent preventive examination.  Review of Systems    Review of Systems  All other systems reviewed and are negative.    Objective:    There were no vitals filed for this visit. There is no height or weight on file to calculate BMI.     02/27/2023    9:37 AM 08/06/2021   12:01 PM 02/13/2019    3:00 PM  Advanced Directives  Does Patient Have a Medical Advance Directive? No No No  Would patient like information on creating a medical advance directive?  No - Patient declined No - Patient declined    Current Medications (verified) Outpatient Encounter Medications as of 02/27/2023  Medication Sig   albuterol (VENTOLIN HFA) 108 (90 Base) MCG/ACT inhaler Inhale 2 puffs into the lungs every 4 (four) hours as needed for wheezing.   atorvastatin (LIPITOR) 40 MG tablet Take 1 tablet (40 mg total) by mouth daily.   Budeson-Glycopyrrol-Formoterol (BREZTRI AEROSPHERE) 160-9-4.8 MCG/ACT AERO Inhale 2 puffs into the lungs in the morning and at bedtime.   cholecalciferol (VITAMIN D3) 25 MCG (1000 UT) tablet Take 1,000 Units by mouth daily.   ezetimibe (ZETIA) 10 MG tablet Take 1 tablet (10 mg total) by mouth daily.   lisinopril-hydrochlorothiazide (ZESTORETIC) 20-12.5 MG tablet Take 1 tablet by mouth daily.   Omega-3 Fatty Acids (FISH OIL) 1000 MG CAPS Take 1 capsule by mouth daily.   No  facility-administered encounter medications on file as of 02/27/2023.    Allergies (verified) Coconut oil fatty acid diethanolamide [cocamide dea]   History: Past Medical History:  Diagnosis Date   Closed fracture of lateral malleolus 12/02/2010   Qualifier: Diagnosis of  By: Aline Brochure MD, Dorothyann Peng     COPD (chronic obstructive pulmonary disease) ([place])    Hearing loss, central    Past Surgical History:  Procedure Laterality Date   FRACTURE SURGERY     right ankle   Family History  Problem Relation Age of Onset   Cancer Mother    Asthma Sister    Breast cancer Sister    Social History   Socioeconomic History   Marital status: Single    Spouse name: Not on file   Number of children: 1   Years of education: Not on file   Highest education level: Not on file  Occupational History   Occupation: retired  Tobacco Use   Smoking status: Former    Packs/day: 1.00    Types: Cigarettes    Quit date: 12/26/2016    Years since quitting: 6.1   Smokeless tobacco: Never  Vaping Use   Vaping Use: Never used  Substance and Sexual Activity   Alcohol use: Not Currently    Comment: 2 beers per day; one 12 pack per week   Drug use: No   Sexual activity: Not Currently  Other Topics Concern   Not on file  Social History Narrative  Lives home alone.    Social Determinants of Health   Financial Resource Strain: Low Risk  (08/06/2021)   Overall Financial Resource Strain (CARDIA)    Difficulty of Paying Living Expenses: Not hard at all  Food Insecurity: No Food Insecurity (08/06/2021)   Hunger Vital Sign    Worried About Running Out of Food in the Last Year: Never true    Ran Out of Food in the Last Year: Never true  Transportation Needs: No Transportation Needs (08/06/2021)   PRAPARE - Hydrologist (Medical): No    Lack of Transportation (Non-Medical): No  Physical Activity: Sufficiently Active (08/06/2021)   Exercise Vital Sign    Days of Exercise per Week:  5 days    Minutes of Exercise per Session: 30 min  Stress: No Stress Concern Present (08/06/2021)   [place]    Feeling of Stress : Not at all  Social Connections: Moderately Isolated (08/06/2021)   Social Connection and Isolation Panel [NHANES]    Frequency of Communication with Friends and Family: More than three times a week    Frequency of Social Gatherings with Friends and Family: More than three times a week    Attends Religious Services: 1 to 4 times per year    Active Member of [employer] or Organizations: No    Attends Music therapist: Never    Marital Status: Divorced    Tobacco Counseling Counseling given: Not Answered   Clinical Intake:  Pre-visit preparation completed: Yes           How often do you need to have someone help you when you read instructions, pamphlets, or other written materials from your doctor or pharmacy?: 1 - Never What is the last grade level you completed in school?: 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 0 0  Vision? 0 0  Difficulty concentrating or making decisions? 0 0  Walking or climbing stairs? 0 0  Dressing or bathing? 0 0  Doing errands, shopping? 0 0  Preparing Food and eating ?  N  Using the Toilet?  N  In the past six months, have you accidently leaked urine?  N  Do you have problems with loss of bowel control?  N  Managing your Medications?  N  Managing your Finances?  N  Housekeeping or managing your Housekeeping?  N    Patient Care Team: Johnette Abraham, MD as PCP - General (Internal Medicine)  Indicate any recent Medical Services you may have received from other than [facility] providers in the past year (date may be approximate).      Assessment:   This is a routine wellness examination for Jose Villa.  Hearing/Vision screen No results found.  Dietary issues and exercise activities discussed:     Goals Addressed   None    Depression Screen    02/27/2023    9:40 AM 01/20/2023    1:43 PM 07/21/2022    2:00 PM 06/08/2022    3:07 PM 02/03/2022   11:13 AM 01/03/2022   10:00 AM 08/06/2021   12:02 PM  PHQ 2/9 Scores  PHQ - 2 Score 0 0 0 0 0 0 0  PHQ- 9 Score  0         Fall Risk    02/27/2023    9:40 AM 02/23/2023    9:35 AM 01/20/2023    1:43 PM 07/21/2022    2:00 PM 06/08/2022    3:07 PM  Fall Risk   Falls in the past year? 0 0 0 0 0  Number falls in past yr: 0  0 0 0  Injury with Fall? 0  0 0 0  Risk for fall due to :   No Fall Risks No Fall Risks No Fall Risks  Follow up   Falls evaluation completed Falls evaluation completed Falls evaluation completed    [place]:  Any stairs in or around the home? Yes  If so, are there any without handrails? Yes  Home free of loose throw rugs in walkways, pet beds, electrical cords, etc? Yes  Adequate lighting in your home to reduce risk of falls? Yes   ASSISTIVE DEVICES UTILIZED TO PREVENT FALLS:  Life alert? No  Use of a cane, walker or w/c? No  Grab bars in the bathroom? No  Shower chair or bench in shower? Yes  Elevated toilet seat or a handicapped toilet? No    Cognitive Function:    08/06/2021   12:02 PM  MMSE - Mini Mental State Exam  Not completed: Unable to complete        02/27/2023    9:40 AM 08/06/2021   12:02 PM  6CIT Screen  What Year? 0 points 0 points  What month? 0 points 0 points  What time? 0 points 0 points  Count back from 20 0 points 0 points  Months in reverse 0 points 0 points  Repeat phrase 2 points 0 points  Total Score 2 points 0 points    Immunizations Immunization History  Administered Date(s) Administered   Influenza-Unspecified 01/03/2022   Moderna Covid-19 Vaccine Bivalent Booster  40yr & up 01/03/2022   Moderna Sars-Covid-2 Vaccination 02/24/2020, 03/26/2020   PNEUMOCOCCAL CONJUGATE-20 01/03/2022   Tdap 03/26/2020   Zoster Recombinat (Shingrix) 01/03/2022, 06/08/2022    TDAP status: Up to date  Flu Vaccine status: Up to date  Pneumococcal vaccine status: Up to date  Covid-19 vaccine status: Completed vaccines  Qualifies for Shingles Vaccine? Yes   Zostavax completed No   Shingrix Completed?: Yes  Screening Tests Health Maintenance  Topic Date Due   OPHTHALMOLOGY EXAM  Never done   Diabetic kidney evaluation - Urine ACR  Never done   Medicare Annual Wellness (AWV)  08/06/2022   INFLUENZA VACCINE  03/26/2023 (Originally 07/26/2022)   HEMOGLOBIN A1C  07/21/2023   FOOT EXAM  07/22/2023   Diabetic kidney evaluation - eGFR measurement  01/21/2024   Fecal DNA (Cologuard)  06/13/2025   DTaP/Tdap/Td (2 - Td or Tdap) 03/26/2030   Pneumonia Vaccine 72 Years old  Completed   Hepatitis C Screening  Completed   Zoster Vaccines- Shingrix  Completed   HPV VACCINES  Aged Out   COLONOSCOPY (Pts 45-461yrInsurance coverage will need to be confirmed)  Discontinued   COVID-19 Vaccine  Discontinued    Health Maintenance  Health Maintenance Due  Topic Date Due   OPHTHALMOLOGY EXAM  Never done   Diabetic kidney evaluation - Urine ACR  Never done   Medicare Annual Wellness (AWV)  08/06/2022    Colorectal cancer screening: Patient has declined  Lung Cancer Screening: (Low Dose CT Chest recommended if Age 71-80ears, 305  pack-year currently smoking OR have quit w/in 15years.) does qualify.   Lung Cancer Screening Referral: Patient will discuss it in June with Dr.Dixon  Additional Screening:  Hepatitis C Screening: does not qualify; Completed 02/12/2021  Vision Screening: Recommended annual ophthalmology exams for early detection of glaucoma and other disorders of the eye. Is the patient up to date with their annual eye exam?  Yes  Who is the provider or what is  the name of the office in which the patient attends annual eye exams? [facility] If pt is not established with a provider, would they like to be referred to a provider to establish care? No .   Dental Screening: Recommended annual dental exams for proper oral hygiene  Community Resource Referral / Chronic Care Management: CRR required this visit?  No   CCM required this visit?  No      Plan:     I have personally reviewed and noted the following in the patient's chart:   Medical and social history Use of alcohol, tobacco or illicit drugs  Current medications and supplements including opioid prescriptions. Patient is not currently taking opioid prescriptions. Functional ability and status Nutritional status Physical activity Advanced directives List of other physicians Hospitalizations, surgeries, and ER visits in previous 12 months Vitals Screenings to include cognitive, depression, and falls Referrals and appointments  In addition, I have reviewed and discussed with patient certain preventive protocols, quality metrics, and best practice recommendations. A written personalized care plan for preventive services as well as general preventive health recommendations were provided to patient.     Jose Dy, MD   02/27/2023

## 2023-04-11 ENCOUNTER — Other Ambulatory Visit: Payer: Self-pay | Admitting: Internal Medicine

## 2023-04-11 DIAGNOSIS — I10 Essential (primary) hypertension: Secondary | ICD-10-CM

## 2023-04-28 DIAGNOSIS — K080 Exfoliation of teeth due to systemic causes: Secondary | ICD-10-CM | POA: Diagnosis not present

## 2023-05-04 ENCOUNTER — Other Ambulatory Visit: Payer: Self-pay | Admitting: Nurse Practitioner

## 2023-05-04 DIAGNOSIS — E782 Mixed hyperlipidemia: Secondary | ICD-10-CM

## 2023-06-15 ENCOUNTER — Other Ambulatory Visit: Payer: Medicare Other

## 2023-06-15 DIAGNOSIS — R972 Elevated prostate specific antigen [PSA]: Secondary | ICD-10-CM | POA: Diagnosis not present

## 2023-06-16 ENCOUNTER — Other Ambulatory Visit: Payer: Self-pay

## 2023-06-16 ENCOUNTER — Telehealth: Payer: Self-pay

## 2023-06-16 DIAGNOSIS — R972 Elevated prostate specific antigen [PSA]: Secondary | ICD-10-CM

## 2023-06-16 LAB — PSA, TOTAL AND FREE
PSA, Free Pct: 22.3 %
PSA, Free: 1.07 ng/mL
Prostate Specific Ag, Serum: 4.8 ng/mL — ABNORMAL HIGH (ref 0.0–4.0)

## 2023-06-16 NOTE — Telephone Encounter (Signed)
-----   Message from Bjorn Pippin, MD sent at 06/16/2023  1:00 PM EDT ----- His PSA is 4.8 which is minimally changed over the past year.  If he is doing well we could reschedule him from 6/27 and push him out 6 months with another PSA ----- Message ----- From: Grier Rocher, CMA Sent: 06/16/2023   8:35 AM EDT To: Bjorn Pippin, MD  F/u 06/27

## 2023-06-16 NOTE — Telephone Encounter (Signed)
Patient aware of MD response, he is doing well and agreed to move his apt out 6 months.  OV and lab apt scheduled and orders placed per MD.

## 2023-06-22 ENCOUNTER — Ambulatory Visit: Payer: Medicare Other | Admitting: Urology

## 2023-06-27 DIAGNOSIS — H524 Presbyopia: Secondary | ICD-10-CM | POA: Diagnosis not present

## 2023-07-21 ENCOUNTER — Ambulatory Visit (INDEPENDENT_AMBULATORY_CARE_PROVIDER_SITE_OTHER): Payer: Medicare Other | Admitting: Internal Medicine

## 2023-07-21 ENCOUNTER — Encounter: Payer: Self-pay | Admitting: Internal Medicine

## 2023-07-21 VITALS — BP 112/74 | HR 82 | Ht 71.0 in | Wt 244.6 lb

## 2023-07-21 DIAGNOSIS — I10 Essential (primary) hypertension: Secondary | ICD-10-CM

## 2023-07-21 DIAGNOSIS — E118 Type 2 diabetes mellitus with unspecified complications: Secondary | ICD-10-CM

## 2023-07-21 DIAGNOSIS — E782 Mixed hyperlipidemia: Secondary | ICD-10-CM | POA: Diagnosis not present

## 2023-07-21 DIAGNOSIS — J449 Chronic obstructive pulmonary disease, unspecified: Secondary | ICD-10-CM

## 2023-07-21 LAB — POCT GLYCOSYLATED HEMOGLOBIN (HGB A1C): Hemoglobin A1C: 6.1 % — AB (ref 4.0–5.6)

## 2023-07-21 NOTE — Addendum Note (Signed)
Addended by: Telford Nab on: 07/21/2023 03:28 PM   Modules accepted: Orders

## 2023-07-21 NOTE — Assessment & Plan Note (Signed)
He is currently prescribed atorvastatin 40 mg daily as well as ezetimibe 10 mg daily.  Lipid panel updated in January.  Total cholesterol 119 and LDL 55.  No medication changes are indicated today.

## 2023-07-21 NOTE — Patient Instructions (Signed)
It was a pleasure to see you today.  Thank you for giving [place] the opportunity to be involved in your care.  Below is a brief recap of your visit and next steps.  We will plan to see you again in 6 months.  Summary No medication changes today We will check your A1c Please complete the previously ordered urine study when able Follow up in 6 months

## 2023-07-21 NOTE — Assessment & Plan Note (Signed)
Currently prescribed Breztri for twice daily use and an albuterol inhaler as needed.  He is asymptomatic today.  Pulmonary exam unremarkable.  No medication changes are indicated today.

## 2023-07-21 NOTE — Progress Notes (Signed)
Established Patient Office Visit  Subjective   Patient ID: Jose Villa, male    DOB: 1951/04/27  Age: 72 y.o. MRN: [id number]  Chief Complaint  Patient presents with   Hypertension    Follow up   Jose Villa returns to care today for routine follow-up.  He was last evaluated by me on 1/26.  At that time lisinopril and HCTZ were discontinued and Zestoretic (lisinopril-HCTZ) 20-12.5 mg daily was prescribed.  There have been no acute interval events.  Jose Villa reports feeling well today.  He is asymptomatic and has no acute concerns to discuss.  Past Medical History:  Diagnosis Date   Closed fracture of lateral malleolus 12/02/2010   Qualifier: Diagnosis of  By: Romeo Apple MD, Duffy Rhody     COPD (chronic obstructive pulmonary disease) (HCC)    Hearing loss, central    Past Surgical History:  Procedure Laterality Date   FRACTURE SURGERY     right ankle   Social History   Tobacco Use   Smoking status: Former    Current packs/day: 0.00    Types: Cigarettes    Quit date: 12/26/2016    Years since quitting: 6.5   Smokeless tobacco: Never  Vaping Use   Vaping status: Never Used  Substance Use Topics   Alcohol use: Not Currently    Comment: 2 beers per day; one 12 pack per week   Drug use: No   Family History  Problem Relation Age of Onset   Cancer Mother    Asthma Sister    Breast cancer Sister    Allergies  Allergen Reactions   Coconut Oil Fatty Acid Diethanolamide [Cocamide Dea] Hives   Review of Systems  Constitutional:  Negative for chills and fever.  HENT:  Negative for sore throat.   Respiratory:  Negative for cough and shortness of breath.   Cardiovascular:  Negative for chest pain, palpitations and leg swelling.  Gastrointestinal:  Negative for abdominal pain, blood in stool, constipation, diarrhea, nausea and vomiting.  Genitourinary:  Negative for dysuria and hematuria.  Musculoskeletal:  Negative for myalgias.  Skin:  Negative for itching and rash.  Neurological:   Negative for dizziness and headaches.  Psychiatric/Behavioral:  Negative for depression and suicidal ideas.       Objective:     BP 112/74   Pulse 82   Ht 5\' 11"  (1.803 m)   Wt 244 lb 9.6 oz (110.9 kg)   SpO2 90%   BMI 34.11 kg/m  BP Readings from Last 3 Encounters:  07/21/23 112/74  01/20/23 115/72  08/18/22 125/75   Physical Exam Vitals reviewed.  Constitutional:      General: He is not in acute distress.    Appearance: Normal appearance. He is obese. He is not ill-appearing.  HENT:     Head: Normocephalic and atraumatic.     Right Ear: External ear normal.     Left Ear: External ear normal.     Nose: Nose normal. No congestion or rhinorrhea.     Mouth/Throat:     Mouth: Mucous membranes are moist.     Pharynx: Oropharynx is clear.  Eyes:     General: No scleral icterus.    Extraocular Movements: Extraocular movements intact.     Conjunctiva/sclera: Conjunctivae normal.     Pupils: Pupils are equal, round, and reactive to light.  Cardiovascular:     Rate and Rhythm: Normal rate and regular rhythm.     Pulses: Normal pulses.     Heart sounds:  Normal heart sounds. No murmur heard. Pulmonary:     Effort: Pulmonary effort is normal.     Breath sounds: Normal breath sounds. No wheezing, rhonchi or rales.  Abdominal:     General: Abdomen is flat. Bowel sounds are normal. There is no distension.     Palpations: Abdomen is soft.     Tenderness: There is no abdominal tenderness.  Musculoskeletal:        General: No swelling or deformity. Normal range of motion.     Cervical back: Normal range of motion.  Skin:    General: Skin is warm and dry.     Capillary Refill: Capillary refill takes less than 2 seconds.  Neurological:     General: No focal deficit present.     Mental Status: He is alert and oriented to person, place, and time.     Motor: No weakness.  Psychiatric:        Mood and Affect: Mood normal.        Behavior: Behavior normal.        Thought  Content: Thought content normal.     Last CBC Lab Results  Component Value Date   WBC 10.8 01/20/2023   HGB 15.9 01/20/2023   HCT 48.4 01/20/2023   MCV 99 (H) 01/20/2023   MCH 32.4 01/20/2023   RDW 12.5 01/20/2023   PLT 237 01/20/2023   Last metabolic panel Lab Results  Component Value Date   GLUCOSE 83 01/20/2023   NA 138 01/20/2023   K 4.5 01/20/2023   CL 96 01/20/2023   CO2 25 01/20/2023   BUN 20 01/20/2023   CREATININE 1.05 01/20/2023   EGFR 76 01/20/2023   CALCIUM 10.5 (H) 01/20/2023   PROT 7.3 01/20/2023   ALBUMIN 4.8 01/20/2023   LABGLOB 2.5 01/20/2023   AGRATIO 1.9 01/20/2023   BILITOT 0.5 01/20/2023   ALKPHOS 88 01/20/2023   AST 24 01/20/2023   ALT 23 01/20/2023   ANIONGAP 11 02/13/2019   Last lipids Lab Results  Component Value Date   CHOL 119 01/20/2023   HDL 35 (L) 01/20/2023   LDLCALC 55 01/20/2023   TRIG 173 (H) 01/20/2023   CHOLHDL 3.4 01/20/2023   Last hemoglobin A1c Lab Results  Component Value Date   HGBA1C 6.4 (H) 01/20/2023   Last thyroid functions Lab Results  Component Value Date   TSH 4.250 01/20/2023   Last vitamin D Lab Results  Component Value Date   VD25OH 34.3 06/02/2022     Assessment & Plan:   Problem List Items Addressed This Visit       Hypertension, essential - Primary    BP remains well-controlled with lisinopril-HCTZ 20-12.5 mg daily.  No medication changes are indicated today.      COPD (chronic obstructive pulmonary disease) (HCC)    Currently prescribed Breztri for twice daily use and an albuterol inhaler as needed.  He is asymptomatic today.  Pulmonary exam unremarkable.  No medication changes are indicated today.      Controlled diabetes mellitus type 2 with complications (HCC)    Diet controlled.  A1c 6.4 in January. -Repeat A1c ordered today.  Urine microalbumin/creatinine ratio is pending collection as well.      Hyperlipidemia    He is currently prescribed atorvastatin 40 mg daily as well as  ezetimibe 10 mg daily.  Lipid panel updated in January.  Total cholesterol 119 and LDL 55.  No medication changes are indicated today.      Return in about  6 months (around 01/21/2024).   Billie Lade, MD

## 2023-07-21 NOTE — Assessment & Plan Note (Signed)
BP remains well-controlled with lisinopril-HCTZ 20-12.5 mg daily.  No medication changes are indicated today.

## 2023-07-21 NOTE — Assessment & Plan Note (Addendum)
Diet controlled.  A1c 6.4 in January. -Repeat A1c ordered today.  Urine microalbumin/creatinine ratio is pending collection as well.

## 2023-07-24 DIAGNOSIS — K080 Exfoliation of teeth due to systemic causes: Secondary | ICD-10-CM | POA: Diagnosis not present

## 2023-08-04 DIAGNOSIS — K080 Exfoliation of teeth due to systemic causes: Secondary | ICD-10-CM | POA: Diagnosis not present

## 2023-08-08 DIAGNOSIS — K080 Exfoliation of teeth due to systemic causes: Secondary | ICD-10-CM | POA: Diagnosis not present

## 2023-09-07 ENCOUNTER — Other Ambulatory Visit: Payer: Self-pay

## 2023-09-07 ENCOUNTER — Telehealth: Payer: Self-pay | Admitting: Internal Medicine

## 2023-09-07 DIAGNOSIS — J449 Chronic obstructive pulmonary disease, unspecified: Secondary | ICD-10-CM

## 2023-09-07 MED ORDER — BREZTRI AEROSPHERE 160-9-4.8 MCG/ACT IN AERO
2.0000 | INHALATION_SPRAY | Freq: Two times a day (BID) | RESPIRATORY_TRACT | 3 refills | Status: DC
Start: 2023-09-07 — End: 2024-01-22

## 2023-09-07 NOTE — Telephone Encounter (Signed)
Refills sent to pharmacy. 

## 2023-09-07 NOTE — Telephone Encounter (Signed)
Prescription Request  09/07/2023  LOV: 07/21/2023  What is the name of the medication or equipment? Bretri aero sphere inhaler 120 inh  Have you contacted your pharmacy to request a refill? No   Which pharmacy would you like this sent to?  [facility] [address] [place]  Patient notified that their request is being sent to the clinical staff for review and that they should receive a response within 2 business days.   Please advise at  walked in office

## 2023-11-30 ENCOUNTER — Other Ambulatory Visit: Payer: Medicare Other

## 2023-12-07 ENCOUNTER — Ambulatory Visit: Payer: Medicare Other | Admitting: Urology

## 2023-12-28 DIAGNOSIS — K080 Exfoliation of teeth due to systemic causes: Secondary | ICD-10-CM | POA: Diagnosis not present

## 2024-01-22 ENCOUNTER — Ambulatory Visit (INDEPENDENT_AMBULATORY_CARE_PROVIDER_SITE_OTHER): Payer: Medicare Other | Admitting: Internal Medicine

## 2024-01-22 ENCOUNTER — Encounter: Payer: Self-pay | Admitting: Internal Medicine

## 2024-01-22 VITALS — BP 110/67 | HR 78 | Ht 70.0 in | Wt 248.6 lb

## 2024-01-22 DIAGNOSIS — Z6833 Body mass index (BMI) 33.0-33.9, adult: Secondary | ICD-10-CM

## 2024-01-22 DIAGNOSIS — J449 Chronic obstructive pulmonary disease, unspecified: Secondary | ICD-10-CM | POA: Diagnosis not present

## 2024-01-22 DIAGNOSIS — E66811 Obesity, class 1: Secondary | ICD-10-CM | POA: Diagnosis not present

## 2024-01-22 DIAGNOSIS — E118 Type 2 diabetes mellitus with unspecified complications: Secondary | ICD-10-CM

## 2024-01-22 DIAGNOSIS — I10 Essential (primary) hypertension: Secondary | ICD-10-CM

## 2024-01-22 DIAGNOSIS — E782 Mixed hyperlipidemia: Secondary | ICD-10-CM | POA: Diagnosis not present

## 2024-01-22 MED ORDER — LISINOPRIL-HYDROCHLOROTHIAZIDE 20-12.5 MG PO TABS: 1.0000 | ORAL_TABLET | Freq: Every day | ORAL | 3 refills | Status: DC

## 2024-01-22 MED ORDER — BREZTRI AEROSPHERE 160-9-4.8 MCG/ACT IN AERO
2.0000 | INHALATION_SPRAY | Freq: Two times a day (BID) | RESPIRATORY_TRACT | 3 refills | Status: DC
Start: 2024-01-22 — End: 2024-02-28

## 2024-01-22 MED ORDER — ATORVASTATIN CALCIUM 40 MG PO TABS: 40.0000 mg | ORAL_TABLET | Freq: Every day | ORAL | 3 refills | Status: DC

## 2024-01-22 MED ORDER — EZETIMIBE 10 MG PO TABS: 10.0000 mg | ORAL_TABLET | Freq: Every day | ORAL | 3 refills | Status: DC

## 2024-01-22 NOTE — Patient Instructions (Signed)
It was a pleasure to see you today.  Thank you for giving [place] the opportunity to be involved in your care.  Below is a brief recap of your visit and next steps.  We will plan to see you again in 6 months.  Summary No medication changes today Refills provided Repeat labs ordered Follow up in 6 months

## 2024-01-22 NOTE — Progress Notes (Signed)
Established Patient Office Visit  Subjective   Patient ID: Jose Villa, male    DOB: [birth year]  Age: 73 y.o. MRN: [id number]  Chief Complaint  Patient presents with   Hypertension    Six month follow up    Jose Villa returns to care today for routine follow-up.  He was last evaluated by me in July 2024.  No medication changes were made at that time and 51-month follow-up was arranged.  There have been no acute interval events. Jose Villa reports feeling well today. He is asymptomatic and has no acute concerns to discuss.   Past Medical History:  Diagnosis Date   Closed fracture of lateral malleolus 12/02/2010   Qualifier: Diagnosis of  By: Romeo Apple MD, Duffy Rhody     COPD (chronic obstructive pulmonary disease) (HCC)    Hearing loss, central    Past Surgical History:  Procedure Laterality Date   FRACTURE SURGERY     right ankle   Social History   Tobacco Use   Smoking status: Former    Current packs/day: 0.00    Types: Cigarettes    Quit date: 12/26/2016    Years since quitting: 7.0   Smokeless tobacco: Never  Vaping Use   Vaping status: Never Used  Substance Use Topics   Alcohol use: Not Currently    Comment: 2 beers per day; one 12 pack per week   Drug use: No   Family History  Problem Relation Age of Onset   Cancer Mother    Asthma Sister    Breast cancer Sister    Allergies  Allergen Reactions   Coconut Oil Fatty Acid Diethanolamide [Cocamide Dea] Hives   Review of Systems  Constitutional:  Negative for chills and fever.  HENT:  Negative for sore throat.   Respiratory:  Negative for cough and shortness of breath.   Cardiovascular:  Negative for chest pain, palpitations and leg swelling.  Gastrointestinal:  Negative for abdominal pain, blood in stool, constipation, diarrhea, nausea and vomiting.  Genitourinary:  Negative for dysuria and hematuria.  Musculoskeletal:  Negative for myalgias.  Skin:  Negative for itching and rash.  Neurological:  Negative for  dizziness and headaches.  Psychiatric/Behavioral:  Negative for depression and suicidal ideas.      Objective:     BP 110/67 (BP Location: Right Arm, Patient Position: Sitting, Cuff Size: Large)   Pulse 78   Ht 5\' 10"  (1.778 m)   Wt 248 lb 9.6 oz (112.8 kg)   SpO2 90%   BMI 35.67 kg/m  BP Readings from Last 3 Encounters:  01/22/24 110/67  07/21/23 112/74  01/20/23 115/72   Physical Exam Vitals reviewed.  Constitutional:      General: He is not in acute distress.    Appearance: Normal appearance. He is obese. He is not ill-appearing.  HENT:     Head: Normocephalic and atraumatic.     Right Ear: External ear normal.     Left Ear: External ear normal.     Nose: Nose normal. No congestion or rhinorrhea.     Mouth/Throat:     Mouth: Mucous membranes are moist.     Pharynx: Oropharynx is clear.  Eyes:     General: No scleral icterus.    Extraocular Movements: Extraocular movements intact.     Conjunctiva/sclera: Conjunctivae normal.     Pupils: Pupils are equal, round, and reactive to light.  Cardiovascular:     Rate and Rhythm: Normal rate and regular rhythm.  Pulses: Normal pulses.     Heart sounds: Normal heart sounds. No murmur heard. Pulmonary:     Effort: Pulmonary effort is normal.     Breath sounds: Normal breath sounds. No wheezing, rhonchi or rales.  Abdominal:     General: Abdomen is flat. Bowel sounds are normal. There is no distension.     Palpations: Abdomen is soft.     Tenderness: There is no abdominal tenderness.     Hernia: A hernia (Reducible umbilical hernia) is present.  Musculoskeletal:        General: No swelling or deformity. Normal range of motion.     Cervical back: Normal range of motion.  Skin:    General: Skin is warm and dry.     Capillary Refill: Capillary refill takes less than 2 seconds.  Neurological:     General: No focal deficit present.     Mental Status: He is alert and oriented to person, place, and time.     Motor: No  weakness.  Psychiatric:        Mood and Affect: Mood normal.        Behavior: Behavior normal.        Thought Content: Thought content normal.   Diabetic foot exam was performed.  No deformities or other abnormal visual findings.  Posterior tibialis and dorsalis pulse intact bilaterally.  Intact to touch and monofilament testing bilaterally.    Last CBC Lab Results  Component Value Date   WBC 10.8 01/20/2023   HGB 15.9 01/20/2023   HCT 48.4 01/20/2023   MCV 99 (H) 01/20/2023   MCH 32.4 01/20/2023   RDW 12.5 01/20/2023   PLT 237 01/20/2023   Last metabolic panel Lab Results  Component Value Date   GLUCOSE 83 01/20/2023   NA 138 01/20/2023   K 4.5 01/20/2023   CL 96 01/20/2023   CO2 25 01/20/2023   BUN 20 01/20/2023   CREATININE 1.05 01/20/2023   EGFR 76 01/20/2023   CALCIUM 10.5 (H) 01/20/2023   PROT 7.3 01/20/2023   ALBUMIN 4.8 01/20/2023   LABGLOB 2.5 01/20/2023   AGRATIO 1.9 01/20/2023   BILITOT 0.5 01/20/2023   ALKPHOS 88 01/20/2023   AST 24 01/20/2023   ALT 23 01/20/2023   ANIONGAP 11 02/13/2019   Last lipids Lab Results  Component Value Date   CHOL 119 01/20/2023   HDL 35 (L) 01/20/2023   LDLCALC 55 01/20/2023   TRIG 173 (H) 01/20/2023   CHOLHDL 3.4 01/20/2023   Last hemoglobin A1c Lab Results  Component Value Date   HGBA1C 6.1 (A) 07/21/2023   Last thyroid functions Lab Results  Component Value Date   TSH 4.250 01/20/2023   Last vitamin D Lab Results  Component Value Date   VD25OH 34.3 06/02/2022     Assessment & Plan:   Problem List Items Addressed This Visit       Hypertension, essential   Remains adequately controlled with lisinopril-HCTZ 20-12.5 mg daily.  No medication changes are indicated today.  Refill provided.      COPD (chronic obstructive pulmonary disease) (HCC) - Primary   Asymptomatic currently.  Pulmonary exam is unremarkable.  He is prescribed Breztri for maintenance and albuterol for rescue use.  Breztri refilled  today.      Controlled diabetes mellitus type 2 with complications (HCC)   A1c 6.1 on labs from July.  Remains diet controlled.  Repeat A1c and urine microalbumin/creatinine ratio ordered today.  Diabetic foot exam completed today.  Hyperlipidemia   Lipid panel updated in January 2024.  Total cholesterol 119 and LDL 55.  He is currently prescribed atorvastatin 40 mg daily as well as Zetia 10 mg daily.  Repeat lipid panel ordered today.       Return in about 6 months (around 07/21/2024).    Billie Lade, MD

## 2024-01-22 NOTE — Assessment & Plan Note (Signed)
Asymptomatic currently.  Pulmonary exam is unremarkable.  He is prescribed Breztri for maintenance and albuterol for rescue use.  Breztri refilled today.

## 2024-01-22 NOTE — Assessment & Plan Note (Signed)
A1c 6.1 on labs from July.  Remains diet controlled.  Repeat A1c and urine microalbumin/creatinine ratio ordered today.  Diabetic foot exam completed today.

## 2024-01-22 NOTE — Assessment & Plan Note (Signed)
Remains adequately controlled with lisinopril-HCTZ 20-12.5 mg daily.  No medication changes are indicated today.  Refill provided.

## 2024-01-22 NOTE — Assessment & Plan Note (Signed)
Lipid panel updated in January 2024.  Total cholesterol 119 and LDL 55.  He is currently prescribed atorvastatin 40 mg daily as well as Zetia 10 mg daily.  Repeat lipid panel ordered today.

## 2024-01-23 ENCOUNTER — Encounter: Payer: Self-pay | Admitting: Internal Medicine

## 2024-01-23 ENCOUNTER — Other Ambulatory Visit: Payer: Self-pay | Admitting: Internal Medicine

## 2024-01-24 LAB — CBC WITH DIFFERENTIAL/PLATELET
Basophils Absolute: 0.1 10*3/uL (ref 0.0–0.2)
Basos: 1 %
EOS (ABSOLUTE): 0.5 10*3/uL — ABNORMAL HIGH (ref 0.0–0.4)
Eos: 5 %
Hematocrit: 49.1 % (ref 37.5–51.0)
Hemoglobin: 16.1 g/dL (ref 13.0–17.7)
Immature Grans (Abs): 0.1 10*3/uL (ref 0.0–0.1)
Immature Granulocytes: 1 %
Lymphocytes Absolute: 2.5 10*3/uL (ref 0.7–3.1)
Lymphs: 24 %
MCH: 32.1 pg (ref 26.6–33.0)
MCHC: 32.8 g/dL (ref 31.5–35.7)
MCV: 98 fL — ABNORMAL HIGH (ref 79–97)
Monocytes Absolute: 1.3 10*3/uL — ABNORMAL HIGH (ref 0.1–0.9)
Monocytes: 12 %
Neutrophils Absolute: 6 10*3/uL (ref 1.4–7.0)
Neutrophils: 57 %
Platelets: 246 10*3/uL (ref 150–450)
RBC: 5.02 x10E6/uL (ref 4.14–5.80)
RDW: 12.3 % (ref 11.6–15.4)
WBC: 10.5 10*3/uL (ref 3.4–10.8)

## 2024-01-24 LAB — VITAMIN D 25 HYDROXY (VIT D DEFICIENCY, FRACTURES): Vit D, 25-Hydroxy: 27.3 ng/mL — ABNORMAL LOW (ref 30.0–100.0)

## 2024-01-24 LAB — MICROALBUMIN / CREATININE URINE RATIO
Creatinine, Urine: 114.6 mg/dL
Microalb/Creat Ratio: 5 mg/g{creat} (ref 0–29)
Microalbumin, Urine: 5.4 ug/mL

## 2024-01-24 LAB — CMP14+EGFR
ALT: 16 [IU]/L (ref 0–44)
AST: 20 [IU]/L (ref 0–40)
Albumin: 4.4 g/dL (ref 3.8–4.8)
Alkaline Phosphatase: 99 [IU]/L (ref 44–121)
BUN/Creatinine Ratio: 17 (ref 10–24)
BUN: 19 mg/dL (ref 8–27)
Bilirubin Total: 0.3 mg/dL (ref 0.0–1.2)
CO2: 26 mmol/L (ref 20–29)
Calcium: 10.9 mg/dL — ABNORMAL HIGH (ref 8.6–10.2)
Chloride: 95 mmol/L — ABNORMAL LOW (ref 96–106)
Creatinine, Ser: 1.09 mg/dL (ref 0.76–1.27)
Globulin, Total: 3.1 g/dL (ref 1.5–4.5)
Glucose: 77 mg/dL (ref 70–99)
Potassium: 4.6 mmol/L (ref 3.5–5.2)
Sodium: 139 mmol/L (ref 134–144)
Total Protein: 7.5 g/dL (ref 6.0–8.5)
eGFR: 73 mL/min/{1.73_m2} (ref >59)

## 2024-01-24 LAB — LIPID PANEL
Chol/HDL Ratio: 3.6 {ratio} (ref 0.0–5.0)
Cholesterol, Total: 131 mg/dL (ref 100–199)
HDL: 36 mg/dL — ABNORMAL LOW (ref >39)
LDL Chol Calc (NIH): 66 mg/dL (ref 0–99)
Triglycerides: 172 mg/dL — ABNORMAL HIGH (ref 0–149)
VLDL Cholesterol Cal: 29 mg/dL (ref 5–40)

## 2024-01-24 LAB — HEMOGLOBIN A1C
Est. average glucose Bld gHb Est-mCnc: 148 mg/dL
Hgb A1c MFr Bld: 6.8 % — ABNORMAL HIGH (ref 4.8–5.6)

## 2024-01-24 LAB — B12 AND FOLATE PANEL
Folate: 7.7 ng/mL (ref >3.0)
Vitamin B-12: 485 pg/mL (ref 232–1245)

## 2024-01-24 LAB — TSH+FREE T4
Free T4: 0.93 ng/dL (ref 0.82–1.77)
TSH: 4.83 u[IU]/mL — ABNORMAL HIGH (ref 0.450–4.500)

## 2024-02-13 DIAGNOSIS — K080 Exfoliation of teeth due to systemic causes: Secondary | ICD-10-CM | POA: Diagnosis not present

## 2024-02-23 DIAGNOSIS — K080 Exfoliation of teeth due to systemic causes: Secondary | ICD-10-CM | POA: Diagnosis not present

## 2024-02-28 ENCOUNTER — Other Ambulatory Visit: Payer: Self-pay | Admitting: Internal Medicine

## 2024-02-28 ENCOUNTER — Ambulatory Visit (INDEPENDENT_AMBULATORY_CARE_PROVIDER_SITE_OTHER): Payer: Medicare Other

## 2024-02-28 VITALS — BP 110/67 | Ht 71.0 in | Wt 248.0 lb

## 2024-02-28 DIAGNOSIS — Z0000 Encounter for general adult medical examination without abnormal findings: Secondary | ICD-10-CM

## 2024-02-28 DIAGNOSIS — J449 Chronic obstructive pulmonary disease, unspecified: Secondary | ICD-10-CM

## 2024-02-28 MED ORDER — BREZTRI AEROSPHERE 160-9-4.8 MCG/ACT IN AERO: 2.0000 | INHALATION_SPRAY | Freq: Two times a day (BID) | RESPIRATORY_TRACT | 3 refills | Status: DC

## 2024-02-28 NOTE — Patient Instructions (Signed)
 [name] , Thank you for taking time to come for your Medicare Wellness Visit. I appreciate your ongoing commitment to your health goals. Please review the following plan we discussed and let me know if I can assist you in the future.   Referrals/Orders/Follow-Ups/Clinician Recommendations:  Next Medicare Annual Wellness Visit:   March 03, 2025 at 1:50 pm telephone visit  This is a list of the screening recommended for you and due dates:  Health Maintenance  Topic Date Due   Eye exam for diabetics  Never done   Hemoglobin A1C  07/21/2024   Yearly kidney function blood test for diabetes  01/21/2025   Yearly kidney health urinalysis for diabetes  01/21/2025   Complete foot exam   01/21/2025   Medicare Annual Wellness Visit  02/27/2025   Cologuard (Stool DNA test)  06/13/2025   DTaP/Tdap/Td vaccine (2 - Td or Tdap) 03/26/2030   Pneumonia Vaccine  Completed   Flu Shot  Completed   Hepatitis C Screening  Completed   Zoster (Shingles) Vaccine  Completed   HPV Vaccine  Aged Out   Colon Cancer Screening  Discontinued   COVID-19 Vaccine  Discontinued    Advanced directives: (Declined) Advance directive discussed with you today. Even though you declined this today, please call our office should you change your mind, and we can give you the proper paperwork for you to fill out.  Next Medicare Annual Wellness Visit scheduled for next year: yes  Understanding Your Risk for Falls Millions of people have serious injuries from falls each year. It is important to understand your risk of falling. Talk with your health care provider about your risk and what you can do to lower it. If you do have a serious fall, make sure to tell your provider. Falling once raises your risk of falling again. How can falls affect me? Serious injuries from falls are common. These include: Broken bones, such as hip fractures. Head injuries, such as traumatic brain injuries (TBI) or concussions. A fear of falling can  cause you to avoid activities and stay at home. This can make your muscles weaker and raise your risk for a fall. What can increase my risk? There are a number of risk factors that increase your risk for falling. The more risk factors you have, the higher your risk of falling. Serious injuries from a fall happen most often to people who are older than 72 years old. Teenagers and young adults ages 4-29 are also at higher risk. Common risk factors include: Weakness in the lower body. Being generally weak or confused due to long-term (chronic) illness. Dizziness or balance problems. Poor vision. Medicines that cause dizziness or drowsiness. These may include: Medicines for your blood pressure, heart, anxiety, insomnia, or swelling (edema). Pain medicines. Muscle relaxants. Other risk factors include: Drinking alcohol. Having had a fall in the past. Having foot pain or wearing improper footwear. Working at a dangerous job. Having any of the following in your home: Tripping hazards, such as floor clutter or loose rugs. Poor lighting. Pets. Having dementia or memory loss. What actions can I take to lower my risk of falling?     Physical activity Stay physically fit. Do strength and balance exercises. Consider taking a regular class to build strength and balance. Yoga and tai chi are good options. Vision Have your eyes checked every year and your prescription for glasses or contacts updated as needed. Shoes and walking aids Wear non-skid shoes. Wear shoes that have rubber soles  and low heels. Do not wear high heels. Do not walk around the house in socks or slippers. Use a cane or walker as told by your provider. Home safety Attach secure railings on both sides of your stairs. Install grab bars for your bathtub, shower, and toilet. Use a non-skid mat in your bathtub or shower. Attach bath mats securely with double-sided, non-slip rug tape. Use good lighting in all rooms. Keep a  flashlight near your bed. Make sure there is a clear path from your bed to the bathroom. Use night-lights. Do not use throw rugs. Make sure all carpeting is taped or tacked down securely. Remove all clutter from walkways and stairways, including extension cords. Repair uneven or broken steps and floors. Avoid walking on icy or slippery surfaces. Walk on the grass instead of on icy or slick sidewalks. Use ice melter to get rid of ice on walkways in the winter. Use a cordless phone. Questions to ask your health care provider Can you help me check my risk for a fall? Do any of my medicines make me more likely to fall? Should I take a vitamin D supplement? What exercises can I do to improve my strength and balance? Should I make an appointment to have my vision checked? Do I need a bone density test to check for weak bones (osteoporosis)? Would it help to use a cane or a walker? Where to find more information Centers for Disease Control and Prevention, [employer]: TonerPromos.no Community-Based Fall Prevention Programs: TonerPromos.no [employer] on Aging: BaseRingTones.pl Contact a health care provider if: You fall at home. You are afraid of falling at home. You feel weak, drowsy, or dizzy. This information is not intended to replace advice given to you by your health care provider. Make sure you discuss any questions you have with your health care provider. Document Revised: 08/15/2022 Document Reviewed: 08/15/2022 Elsevier Patient Education  2024 [employer].

## 2024-02-28 NOTE — Progress Notes (Signed)
 Please attest and cosign this visit due to patients primary care provider not being in the office at the time the visit was completed.  Because this visit was Jose virtual/telehealth visit,  certain criteria was not obtained, such Jose blood pressure, CBG if applicable, and timed get up and go. Any medications not marked as "taking" were not mentioned during the medication reconciliation part of the visit. Any vitals not documented were not able to be obtained due to this being Jose telehealth visit or patient was unable to self-report Jose recent blood pressure reading due to Jose lack of equipment at home via telehealth. Vitals that have been documented are verbally provided by the patient.   Subjective:   Jose Villa is Jose 73 y.o. who presents for Jose Medicare Wellness preventive visit.  Visit Complete: Virtual I connected with  Jose Villa on 02/28/24 by Jose audio enabled telemedicine application and verified that I am speaking with the correct person using two identifiers.  Patient Location: Home  Provider Location: Home Office  I discussed the limitations of evaluation and management by telemedicine. The patient expressed understanding and agreed to proceed.  Vital Signs: Because this visit was Jose virtual/telehealth visit, some criteria may be missing or patient reported. Any vitals not documented were not able to be obtained and vitals that have been documented are patient reported.  VideoDeclined- This patient declined Librarian, academic. Therefore the visit was completed with audio only.  AWV Questionnaire: No: Patient Medicare AWV questionnaire was not completed prior to this visit.  Cardiac Risk Factors include: advanced age (>17men, >69 women);dyslipidemia;hypertension;male gender;obesity (BMI >30kg/m2);sedentary lifestyle     Objective:    Today's Vitals   02/28/24 0914  BP: 110/67  Weight: 248 lb (112.5 kg)  Height: 5\' 11"  (1.803 m)   Body mass index is 34.59  kg/m.     02/28/2024    9:17 AM 02/27/2023    9:37 AM 08/06/2021   12:01 PM 02/13/2019    3:00 PM  Advanced Directives  Does Patient Have Jose Medical Advance Directive? No No No No  Would patient like information on creating Jose medical advance directive? No - Patient declined  No - Patient declined No - Patient declined    Current Medications (verified) Outpatient Encounter Medications as of 02/28/2024  Medication Sig   albuterol (VENTOLIN HFA) 108 (90 Base) MCG/ACT inhaler Inhale 2 puffs into the lungs every 4 (four) hours as needed for wheezing.   atorvastatin (LIPITOR) 40 MG tablet Take 1 tablet (40 mg total) by mouth daily.   Budeson-Glycopyrrol-Formoterol (BREZTRI AEROSPHERE) 160-9-4.8 MCG/ACT AERO Inhale 2 puffs into the lungs in the morning and at bedtime.   cholecalciferol (VITAMIN D3) 25 MCG (1000 UT) tablet Take 1,000 Units by mouth daily.   ezetimibe (ZETIA) 10 MG tablet Take 1 tablet (10 mg total) by mouth daily.   lisinopril-hydrochlorothiazide (ZESTORETIC) 20-12.5 MG tablet Take 1 tablet by mouth daily.   Omega-3 Fatty Acids (FISH OIL) 1000 MG CAPS Take 1 capsule by mouth daily.   No facility-administered encounter medications on file as of 02/28/2024.    Allergies (verified) Coconut oil fatty acid diethanolamide [cocamide dea]   History: Past Medical History:  Diagnosis Date   Closed fracture of lateral malleolus 12/02/2010   Qualifier: Diagnosis of  By: Romeo Apple MD, Duffy Rhody     COPD (chronic obstructive pulmonary disease) (HCC)    Hearing loss, central    Past Surgical History:  Procedure Laterality Date   FRACTURE SURGERY  right ankle   Family History  Problem Relation Age of Onset   Cancer Mother    Asthma Sister    Breast cancer Sister    Social History   Socioeconomic History   Marital status: Single    Spouse name: Not on file   Number of children: 1   Years of education: Not on file   Highest education level: Associate degree: occupational,  Scientist, product/process development, or vocational program  Occupational History   Occupation: retired  Tobacco Use   Smoking status: Former    Current packs/day: 0.00    Types: Cigarettes    Quit date: 12/26/2016    Years since quitting: 7.1   Smokeless tobacco: Never  Vaping Use   Vaping status: Never Used  Substance and Sexual Activity   Alcohol use: Not Currently    Comment: 2 beers per day; one 12 pack per week   Drug use: No   Sexual activity: Not Currently  Other Topics Concern   Not on file  Social History Narrative   Lives home alone.    Social Drivers of Corporate investment banker Strain: Low Risk  (02/28/2024)   Overall Financial Resource Strain (CARDIA)    Difficulty of Paying Living Expenses: Not hard at all  Food Insecurity: No Food Insecurity (02/28/2024)   Hunger Vital Sign    Worried About Running Out of Food in the Last Year: Never true    Ran Out of Food in the Last Year: Never true  Transportation Needs: No Transportation Needs (02/28/2024)   PRAPARE - Administrator, Civil Service (Medical): No    Lack of Transportation (Non-Medical): No  Physical Activity: Inactive (02/28/2024)   Exercise Vital Sign    Days of Exercise per Week: 0 days    Minutes of Exercise per Session: 0 min  Stress: No Stress Concern Present (02/28/2024)   [employer] of Occupational Health - Occupational Stress Questionnaire    Feeling of Stress : Not at all  Social Connections: Moderately Isolated (02/28/2024)   Social Connection and Isolation Panel [NHANES]    Frequency of Communication with Friends and Family: More than three times Jose week    Frequency of Social Gatherings with Friends and Family: Once Jose week    Attends Religious Services: 1 to 4 times per year    Active Member of [employer] or Organizations: No    Attends Engineer, structural: Never    Marital Status: Divorced    Tobacco Counseling Counseling given: Not Answered    Clinical Intake:  Pre-visit preparation  completed: Yes  Pain : No/denies pain     BMI - recorded: 34.59 Nutritional Status: BMI > 30  Obese Nutritional Risks: None Diabetes: No  How often do you need to have someone help you when you read instructions, pamphlets, or other written materials from your doctor or pharmacy?: 1 - Never  Interpreter Needed?: No  Information entered by :: Jose Villa, CMA   Activities of Daily Living     02/28/2024    9:16 AM  In your present state of health, do you have any difficulty performing the following activities:  Hearing? 1  Comment wears hearing aids  Vision? 0  Difficulty concentrating or making decisions? 0  Walking or climbing stairs? 0  Dressing or bathing? 0  Doing errands, shopping? 0  Preparing Food and eating ? N  Using the Toilet? N  In the past six months, have you accidently leaked urine? N  Do you have problems with loss of bowel control? N  Managing your Medications? N  Managing your Finances? N  Housekeeping or managing your Housekeeping? N    Patient Care Team: Billie Lade, MD as PCP - General (Internal Medicine)  Indicate any recent Medical Services you may have received from other than [facility] providers in the past year (date may be approximate).     Assessment:   This is Jose routine wellness examination for Jose Villa.  Hearing/Vision screen Hearing Screening - Comments:: Patient wears hearing aids. Patient sees [facility] in [place] Vision Screening - Comments:: Wears rx glasses - up to date with routine eye exams  Patient sees Dr. Daisy Lazar w/ [facility] [place] office.     Goals Addressed             This Visit's Progress    Patient Stated       Lose weight       Depression Screen     02/28/2024    9:19 AM 01/22/2024    2:58 PM 07/21/2023    1:43 PM 02/27/2023    9:40 AM 01/20/2023    1:43 PM 07/21/2022    2:00 PM 06/08/2022    3:07 PM  PHQ 2/9 Scores  PHQ - 2 Score 0 0 0 0 0 0 0  PHQ- 9 Score 0 0 0  0      Fall Risk      02/28/2024    9:18 AM 01/22/2024    2:58 PM 07/21/2023    1:42 PM 02/27/2023    9:40 AM 02/23/2023    9:35 AM  Fall Risk   Falls in the past year? 0 0 0 0 0  Number falls in past yr: 0 0 0 0   Injury with Fall? 0 0 0 0   Risk for fall due to : No Fall Risks No Fall Risks No Fall Risks    Follow up Falls prevention discussed;Falls evaluation completed Falls evaluation completed Falls evaluation completed      MEDICARE RISK AT HOME:  Medicare Risk at Home Any stairs in or around the home?: Yes If so, are there any without handrails?: No Home free of loose throw rugs in walkways, pet beds, electrical cords, etc?: Yes Adequate lighting in your home to reduce risk of falls?: Yes Life alert?: No Use of Jose cane, walker or w/c?: No Grab bars in the bathroom?: No Shower chair or bench in shower?: No Elevated toilet seat or Jose handicapped toilet?: No  TIMED UP AND GO:  Was the test performed?  No  Cognitive Function: 6CIT completed    08/06/2021   12:02 PM  MMSE - Mini Mental State Exam  Not completed: Unable to complete        02/28/2024    9:19 AM 02/27/2023    9:40 AM 08/06/2021   12:02 PM  6CIT Screen  What Year? 0 points 0 points 0 points  What month? 0 points 0 points 0 points  What time? 0 points 0 points 0 points  Count back from 20 0 points 0 points 0 points  Months in reverse 0 points 0 points 0 points  Repeat phrase 0 points 2 points 0 points  Total Score 0 points 2 points 0 points    Immunizations Immunization History  Administered Date(s) Administered   Influenza-Unspecified 01/03/2022, 12/31/2023   Moderna Covid-19 Vaccine Bivalent Booster 44yrs & up 01/03/2022   Moderna Sars-Covid-2 Vaccination 02/24/2020, 03/26/2020  PNEUMOCOCCAL CONJUGATE-20 01/03/2022   Tdap 03/26/2020   Zoster Recombinant(Shingrix) 01/03/2022, 06/08/2022    Screening Tests Health Maintenance  Topic Date Due   OPHTHALMOLOGY EXAM  Never done   HEMOGLOBIN A1C  07/21/2024   Diabetic kidney  evaluation - eGFR measurement  01/21/2025   Diabetic kidney evaluation - Urine ACR  01/21/2025   FOOT EXAM  01/21/2025   Medicare Annual Wellness (AWV)  02/27/2025   Fecal DNA (Cologuard)  06/13/2025   DTaP/Tdap/Td (2 - Td or Tdap) 03/26/2030   Pneumonia Vaccine 61+ Years old  Completed   INFLUENZA VACCINE  Completed   Hepatitis C Screening  Completed   Zoster Vaccines- Shingrix  Completed   HPV VACCINES  Aged Out   Colonoscopy  Discontinued   COVID-19 Vaccine  Discontinued    Health Maintenance  Health Maintenance Due  Topic Date Due   OPHTHALMOLOGY EXAM  Never done   Health Maintenance Items Addressed: Health Maintenance is up to date  Additional Screening:  Vision Screening: Recommended annual ophthalmology exams for early detection of glaucoma and other disorders of the eye.  Dental Screening: Recommended annual dental exams for proper oral hygiene  Community Resource Referral / Chronic Care Management: CRR required this visit?  No   CCM required this visit?  No     Plan:     I have personally reviewed and noted the following in the patient's chart:   Medical and social history Use of alcohol, tobacco or illicit drugs  Current medications and supplements including opioid prescriptions. Patient is not currently taking opioid prescriptions. Functional ability and status Nutritional status Physical activity Advanced directives List of other physicians Hospitalizations, surgeries, and ER visits in previous 12 months Vitals Screenings to include cognitive, depression, and falls Referrals and appointments  In addition, I have reviewed and discussed with patient certain preventive protocols, quality metrics, and best practice recommendations. Jose written personalized care plan for preventive services as well as general preventive health recommendations were provided to patient.     Jordan Hawks Shanya Ferriss, CMA   02/28/2024   After Visit Summary: (MyChart) Due to this  being Jose telephonic visit, the after visit summary with patients personalized plan was offered to patient via MyChart   Notes: Nothing significant to report at this time.

## 2024-03-04 ENCOUNTER — Telehealth: Payer: Self-pay | Admitting: Internal Medicine

## 2024-03-04 ENCOUNTER — Other Ambulatory Visit: Payer: Self-pay | Admitting: Internal Medicine

## 2024-03-04 DIAGNOSIS — J449 Chronic obstructive pulmonary disease, unspecified: Secondary | ICD-10-CM

## 2024-03-04 MED ORDER — TRELEGY ELLIPTA 100-62.5-25 MCG/ACT IN AEPB: 1.0000 | INHALATION_SPRAY | Freq: Every day | RESPIRATORY_TRACT | 11 refills | Status: DC

## 2024-03-04 NOTE — Addendum Note (Signed)
 Addended by: Christel Mormon E on: 03/04/2024 04:50 PM   Modules accepted: Orders

## 2024-03-04 NOTE — Telephone Encounter (Signed)
 Copied from CRM [id number]. Topic: Clinical - Prescription Issue >> Mar 04, 2024  3:07 PM Carlatta H wrote: Reason for CRM: Patient would like to know if Budeson-Glycopyrrol-Formoterol (BREZTRI AEROSPHERE) 160-9-4.8 MCG/ACT AERO [[id number]] Prescription can be switched for something cheaper//Please call patient and advise//

## 2024-03-04 NOTE — Telephone Encounter (Signed)
 Copied from CRM [id number]. Topic: Clinical - Prescription Issue >> Mar 04, 2024  3:07 PM Marland Kitchen D wrote: Jose Villa calling from [facility] shield  medicare called to give a verbal request denial to the office. Patient was requesting a tier exception that was denied for -Budeson-Glycopyrrol-Formoterol (BREZTRI AEROSPHERE) 160-9-4.8 MCG/ACT AERO This information will be faxed over to the office. [phone] option 5

## 2024-03-04 NOTE — Telephone Encounter (Signed)
 Copied from CRM [id number]. Topic: Clinical - Medication Refill >> Mar 04, 2024  3:08 PM Carlatta H wrote: Most Recent Primary Care Visit:  Provider: Recardo Evangelist A  Department: [facility]  Visit Type: MEDICARE AWV, SEQUENTIAL  Date: 02/28/2024  Medication: albuterol (VENTOLIN HFA) 108 (90 Base) MCG/ACT inhaler [[id number]]  Has the patient contacted their pharmacy? No (Agent: If no, request that the patient contact the pharmacy for the refill. If patient does not wish to contact the pharmacy document the reason why and proceed with request.) (Agent: If yes, when and what did the pharmacy advise?)  Is this the correct pharmacy for this prescription? Yes If no, delete pharmacy and type the correct one.  This is the patient's preferred pharmacy:  [id number] - [place] - [address] [place] [address] Phone: [phone] Fax: [phone]     Has the prescription been filled recently? No  Is the patient out of the medication? Yes  Has the patient been seen for an appointment in the last year OR does the patient have an upcoming appointment? Yes  Can we respond through MyChart? No  Agent: Please be advised that Rx refills may take up to 3 business days. We ask that you follow-up with your pharmacy.

## 2024-03-04 NOTE — Telephone Encounter (Signed)
 Last Fill: 01/20/23  Last OV: 02/28/24 Next OV: 07/23/24  Routing to provider for review/authorization.

## 2024-03-05 MED ORDER — ALBUTEROL SULFATE HFA 108 (90 BASE) MCG/ACT IN AERS: 2.0000 | INHALATION_SPRAY | RESPIRATORY_TRACT | 3 refills | Status: DC | PRN

## 2024-03-05 NOTE — Telephone Encounter (Signed)
Patient adivsed

## 2024-03-14 ENCOUNTER — Other Ambulatory Visit: Payer: Medicare Other

## 2024-03-14 DIAGNOSIS — R972 Elevated prostate specific antigen [PSA]: Secondary | ICD-10-CM | POA: Diagnosis not present

## 2024-03-15 LAB — PSA, TOTAL AND FREE
PSA, Free Pct: 19.6 %
PSA, Free: 1.06 ng/mL
Prostate Specific Ag, Serum: 5.4 ng/mL — ABNORMAL HIGH (ref 0.0–4.0)

## 2024-03-21 ENCOUNTER — Ambulatory Visit: Payer: Medicare Other | Admitting: Urology

## 2024-03-21 VITALS — BP 121/77 | HR 101

## 2024-03-21 DIAGNOSIS — N138 Other obstructive and reflux uropathy: Secondary | ICD-10-CM | POA: Diagnosis not present

## 2024-03-21 DIAGNOSIS — R351 Nocturia: Secondary | ICD-10-CM | POA: Diagnosis not present

## 2024-03-21 DIAGNOSIS — N401 Enlarged prostate with lower urinary tract symptoms: Secondary | ICD-10-CM

## 2024-03-21 DIAGNOSIS — R972 Elevated prostate specific antigen [PSA]: Secondary | ICD-10-CM | POA: Diagnosis not present

## 2024-03-21 LAB — URINALYSIS, ROUTINE W REFLEX MICROSCOPIC
Bilirubin, UA: NEGATIVE
Glucose, UA: NEGATIVE
Ketones, UA: NEGATIVE
Leukocytes,UA: NEGATIVE
Nitrite, UA: NEGATIVE
Protein,UA: NEGATIVE
RBC, UA: NEGATIVE
Specific Gravity, UA: 1.02 (ref 1.005–1.030)
Urobilinogen, Ur: 0.2 mg/dL (ref 0.2–1.0)
pH, UA: 6 (ref 5.0–7.5)

## 2024-03-21 NOTE — Progress Notes (Unsigned)
 Subjective: 1. Elevated PSA   2. BPH with urinary obstruction   3. Nocturia      03/21/24: Jose Villa returns otday in f/u.  His PSA was 3.9 in 8/24, 4.8 in 6/24 and now 5.4 with a 19.6% f/t ratio on 03/14/24.  His IPSS is 2 with nocturia.   His UA is clear.   08/18/22: Jose Villa is a 73 yo male who is sent for an elevated PSA of 4.3 on 06/02/22 which is up from 1.04 in 12/15/11.  His IPSS is 2 with nocturia x 2.  The nocturia depends on his intake.  He has had no UTI's or stones.   He has had no GU surgery.  He was unable to get a urine today.      IPSS     Row Name 03/21/24 1300         International Prostate Symptom Score   How often have you had the sensation of not emptying your bladder? Not at All     How often have you had to urinate less than every two hours? Not at All     How often have you found you stopped and started again several times when you urinated? Not at All     How often have you found it difficult to postpone urination? Not at All     How often have you had a weak urinary stream? Not at All     How often have you had to strain to start urination? Not at All     How many times did you typically get up at night to urinate? 2 Times     Total IPSS Score 2       Quality of Life due to urinary symptoms   If you were to spend the rest of your life with your urinary condition just the way it is now how would you feel about that? Pleased              ROS:  Review of Systems  Respiratory:  Positive for cough and shortness of breath.   All other systems reviewed and are negative.   Allergies  Allergen Reactions   Coconut Oil Fatty Acid Diethanolamide [Cocamide Dea] Hives    Past Medical History:  Diagnosis Date   Closed fracture of lateral malleolus 12/02/2010   Qualifier: Diagnosis of  By: Romeo Apple MD, Duffy Rhody     COPD (chronic obstructive pulmonary disease) (HCC)    Hearing loss, central     Past Surgical History:  Procedure Laterality Date   FRACTURE  SURGERY     right ankle    Social History   Socioeconomic History   Marital status: Single    Spouse name: Not on file   Number of children: 1   Years of education: Not on file   Highest education level: Associate degree: occupational, Scientist, product/process development, or vocational program  Occupational History   Occupation: retired  Tobacco Use   Smoking status: Former    Current packs/day: 0.00    Types: Cigarettes    Quit date: 12/26/2016    Years since quitting: 7.2   Smokeless tobacco: Never  Vaping Use   Vaping status: Never Used  Substance and Sexual Activity   Alcohol use: Not Currently    Comment: 2 beers per day; one 12 pack per week   Drug use: No   Sexual activity: Not Currently  Other Topics Concern   Not on file  Social History Narrative  Lives home alone.    Social Drivers of Corporate investment banker Strain: Low Risk  (02/28/2024)   Overall Financial Resource Strain (CARDIA)    Difficulty of Paying Living Expenses: Not hard at all  Food Insecurity: No Food Insecurity (02/28/2024)   Hunger Vital Sign    Worried About Running Out of Food in the Last Year: Never true    Ran Out of Food in the Last Year: Never true  Transportation Needs: No Transportation Needs (02/28/2024)   PRAPARE - Administrator, Civil Service (Medical): No    Lack of Transportation (Non-Medical): No  Physical Activity: Inactive (02/28/2024)   Exercise Vital Sign    Days of Exercise per Week: 0 days    Minutes of Exercise per Session: 0 min  Stress: No Stress Concern Present (02/28/2024)   [employer] of Occupational Health - Occupational Stress Questionnaire    Feeling of Stress : Not at all  Social Connections: Moderately Isolated (02/28/2024)   Social Connection and Isolation Panel [NHANES]    Frequency of Communication with Friends and Family: More than three times a week    Frequency of Social Gatherings with Friends and Family: Once a week    Attends Religious Services: 1 to 4 times per  year    Active Member of [employer] or Organizations: No    Attends Banker Meetings: Never    Marital Status: Divorced  Catering manager Violence: Not At Risk (02/28/2024)   Humiliation, Afraid, Rape, and Kick questionnaire    Fear of Current or Ex-Partner: No    Emotionally Abused: No    Physically Abused: No    Sexually Abused: No    Family History  Problem Relation Age of Onset   Cancer Mother    Asthma Sister    Breast cancer Sister     Anti-infectives: Anti-infectives (From admission, onward)    None       Current Outpatient Medications  Medication Sig Dispense Refill   albuterol (VENTOLIN HFA) 108 (90 Base) MCG/ACT inhaler Inhale 2 puffs into the lungs every 4 (four) hours as needed for wheezing. 1 each 3   atorvastatin (LIPITOR) 40 MG tablet Take 1 tablet (40 mg total) by mouth daily. 90 tablet 3   cholecalciferol (VITAMIN D3) 25 MCG (1000 UT) tablet Take 1,000 Units by mouth daily.     ezetimibe (ZETIA) 10 MG tablet Take 1 tablet (10 mg total) by mouth daily. 90 tablet 3   Fluticasone-Umeclidin-Vilant (TRELEGY ELLIPTA) 100-62.5-25 MCG/ACT AEPB Inhale 1 puff into the lungs daily. 1 each 11   lisinopril-hydrochlorothiazide (ZESTORETIC) 20-12.5 MG tablet Take 1 tablet by mouth daily. 90 tablet 3   Omega-3 Fatty Acids (FISH OIL) 1000 MG CAPS Take 1 capsule by mouth daily.     No current facility-administered medications for this visit.     Objective: Vital signs in last 24 hours: BP 121/77   Pulse (!) 101   Intake/Output from previous day: No intake/output data recorded. Intake/Output this shift: @IOTHISSHIFT @   Physical Exam Vitals reviewed.  Constitutional:      Appearance: Normal appearance. He is obese.  Abdominal:     Hernia: Hernia: umbilical.     Comments: obese  Genitourinary:    Comments:  AP without lesions. Prostate 2+ benign but high riding. SV non-palpable.  Neurological:     Mental Status: He is alert.     Lab Results:  No  results found for this or any previous visit (from the  past 24 hours).  BMET No results for input(s): "NA", "K", "CL", "CO2", "GLUCOSE", "BUN", "CREATININE", "CALCIUM" in the last 72 hours. PT/INR No results for input(s): "LABPROT", "INR" in the last 72 hours. ABG No results for input(s): "PHART", "HCO3" in the last 72 hours.  Invalid input(s): "PCO2", "PO2" PSA reviewed.    UA is clear.  Studies/Results: No results found.   Assessment/Plan: Elevated PSA.   His PSA is up further to 5.4 with a 19.6% f/t ratio.  with  slightly elevated at 4.3 but it was only 1.04 in 2012.  His exam is benign.  I will repeat a PSA with f/t ratio today and if that is up further , I will get an MRI of the prostate and have him return with the results.   He will be unavailable until mid May.   BPH with nocturia.  He has mild LUTS.   No orders of the defined types were placed in this encounter.    Orders Placed This Encounter  Procedures   MR PROSTATE W WO CONTRAST    Needs to be after May 12th    Standing Status:   Future    Expected Date:   05/07/2024    Expiration Date:   09/21/2024    If indicated for the ordered procedure, I authorize the administration of contrast media per Radiology protocol:   Yes    What is the patient's sedation requirement?:   No Sedation    Does the patient have a pacemaker or implanted devices?:   No    Radiology Contrast Protocol - do NOT remove file path:   [email or web address]    Preferred imaging location?:   [facility] (table limit-550lbs)   Urinalysis, Routine w reflex microscopic     Return in about 8 weeks (around 05/16/2024) for He needs f/u with me or available MD with his MR results.   .    CC: Dr. Christel Mormon     Bjorn Pippin 03/21/2024 [phone]

## 2024-03-22 ENCOUNTER — Encounter: Payer: Self-pay | Admitting: Urology

## 2024-05-13 ENCOUNTER — Telehealth: Payer: Self-pay

## 2024-05-13 ENCOUNTER — Ambulatory Visit (HOSPITAL_COMMUNITY)
Admission: RE | Admit: 2024-05-13 | Discharge: 2024-05-13 | Disposition: A | Discharge status: Home / Self Care | Source: Ambulatory Visit | Attending: Urology | Admitting: Urology

## 2024-05-13 DIAGNOSIS — R972 Elevated prostate specific antigen [PSA]: Secondary | ICD-10-CM | POA: Insufficient documentation

## 2024-05-13 DIAGNOSIS — N4289 Other specified disorders of prostate: Secondary | ICD-10-CM | POA: Diagnosis not present

## 2024-05-13 MED ORDER — GADOBUTROL 1 MMOL/ML IV SOLN
10.0000 mL | Freq: Once | INTRAVENOUS | Status: AC | PRN
  Administered 2024-05-13: 10 mL via INTRAVENOUS

## 2024-05-13 NOTE — Telephone Encounter (Signed)
 Copied from CRM [id number]. Topic: Appointments - Scheduling Inquiry for Clinic >> May 13, 2024  2:17 PM Ivette P wrote: Reason for CRM: Reymundo Caulk called because pt has not had Yearly Physical scheduled.    When to attempting to schedule was pushing pt out until 02/2025, Reymundo Caulk would like for a physical to be scheduled for the year 2025.  Please follow up with pt to schedule physical for the year of 2025   Callback [phone] - member outreach team   Reference Case # [id number]

## 2024-05-14 ENCOUNTER — Ambulatory Visit: Payer: Self-pay

## 2024-05-22 DIAGNOSIS — H524 Presbyopia: Secondary | ICD-10-CM | POA: Diagnosis not present

## 2024-05-30 ENCOUNTER — Encounter: Payer: Self-pay | Admitting: Urology

## 2024-05-30 ENCOUNTER — Ambulatory Visit: Admitting: Urology

## 2024-05-30 VITALS — BP 107/64 | HR 87

## 2024-05-30 DIAGNOSIS — R972 Elevated prostate specific antigen [PSA]: Secondary | ICD-10-CM | POA: Diagnosis not present

## 2024-05-30 DIAGNOSIS — R9349 Abnormal radiologic findings on diagnostic imaging of other urinary organs: Secondary | ICD-10-CM | POA: Diagnosis not present

## 2024-05-30 DIAGNOSIS — R351 Nocturia: Secondary | ICD-10-CM | POA: Diagnosis not present

## 2024-05-30 DIAGNOSIS — N401 Enlarged prostate with lower urinary tract symptoms: Secondary | ICD-10-CM

## 2024-05-30 LAB — URINALYSIS, ROUTINE W REFLEX MICROSCOPIC
Bilirubin, UA: NEGATIVE
Glucose, UA: NEGATIVE
Ketones, UA: NEGATIVE
Leukocytes,UA: NEGATIVE
Nitrite, UA: NEGATIVE
Protein,UA: NEGATIVE
RBC, UA: NEGATIVE
Specific Gravity, UA: 1.01 (ref 1.005–1.030)
Urobilinogen, Ur: 0.2 mg/dL (ref 0.2–1.0)
pH, UA: 6.5 (ref 5.0–7.5)

## 2024-05-30 MED ORDER — LEVOFLOXACIN 750 MG PO TABS: ORAL_TABLET | ORAL | 0 refills | Status: DC

## 2024-05-30 NOTE — Patient Instructions (Signed)
 Prostate Biopsy Instructions:  Appointment Time: Appointment Date: Location:    Please arrive 15 minutes prior to your scheduled visit time to allow registration time. If you are late for your scheduled visit time, you may be asked to reschedule due to time restraints.  You will need to stop all aspirin or blood thinners (aspirin, plavix, coumadin, warfarin, motrin, ibuprofen, advil, aleve, naproxen, naprosyn) for 7 days prior to the procedure.  If you have any questions about stopping these medications, please contact your prescribing doctor.  Our office will obtain clearance for your blood thinners to be held- please do not stop these medications until you have been given instruction to do so.  Having a light meal prior to the procedure is recommended.  If you are diabetic or have low blood sugar please bring a small snack or glucose tablet.  A Fleets enema is needed to be purchased over the counter at a local pharmacy and used 2 hours before you scheduled appointment.  This can be purchased over the counter at any pharmacy.  One antibiotic tablet has been sent to your pharmacy. Please pick this prescription up and take 2 hours prior to your scheduled biopsy appointment. You will also be given an additional antibiotic injection at the biopsy appointment.   Please bring someone with you to the procedure to drive you home if you are given a valium to take prior to your procedure.   If you have any questions or concerns, please feel free to call the office at [phone] or send a Mychart message.  What you can expect after your prostate biopsy:  You may notice blood in your urine or stool up to 1 week after your procedure. You may notice blood in your semen up to 1 month after your procedure.  If you are unable to pee afterwards or develop a fever, please call our office at [phone] and hit option 3 to speak with a clinical staff member.  Your physicain will notify you of your  results.    Thank you, [facility]

## 2024-05-30 NOTE — Progress Notes (Unsigned)
 Subjective: 1. Elevated PSA   2. Abnormal findings on diagnostic imaging of urinary organs     05/30/24: [name] returns today in f/u.  He was found to have a small PIRADs 4 lesion in the right prostate in a 31ml prostate on MRIP.    03/21/24: [name] returns otday in f/u.  His PSA was 3.9 in 8/24, 4.8 in 6/24 and now 5.4 with a 19.6% f/t ratio on 03/14/24.  His IPSS is 2 with nocturia.   His UA is clear.   08/18/22: [name] is a 73 yo male who is sent for an elevated PSA of 4.3 on 06/02/22 which is up from 1.04 in 12/15/11.  His IPSS is 2 with nocturia x 2.  The nocturia depends on his intake.  He has had no UTI's or stones.   He has had no GU surgery.  He was unable to get a urine today.        ROS:  Review of Systems  Respiratory:  Positive for cough and shortness of breath.   All other systems reviewed and are negative.   Allergies  Allergen Reactions   Coconut Oil Fatty Acid Diethanolamide [Cocamide Dea] Hives    Past Medical History:  Diagnosis Date   Closed fracture of lateral malleolus 12/02/2010   Qualifier: Diagnosis of  By: Phyllis Breeze MD, Arvel Lather     COPD (chronic obstructive pulmonary disease) (HCC)    Hearing loss, central     Past Surgical History:  Procedure Laterality Date   FRACTURE SURGERY     right ankle    Social History   Socioeconomic History   Marital status: Single    Spouse name: Not on file   Number of children: 1   Years of education: Not on file   Highest education level: Associate degree: occupational, Scientist, product/process development, or vocational program  Occupational History   Occupation: retired  Tobacco Use   Smoking status: Former    Current packs/day: 0.00    Types: Cigarettes    Quit date: 12/26/2016    Years since quitting: 7.4   Smokeless tobacco: Never  Vaping Use   Vaping status: Never Used  Substance and Sexual Activity   Alcohol use: Not Currently    Comment: 2 beers per day; one 12 pack per week   Drug use: No   Sexual activity: Not Currently   Other Topics Concern   Not on file  Social History Narrative   Lives home alone.    Social Drivers of Corporate investment banker Strain: Low Risk  (02/28/2024)   Overall Financial Resource Strain (CARDIA)    Difficulty of Paying Living Expenses: Not hard at all  Food Insecurity: No Food Insecurity (02/28/2024)   Hunger Vital Sign    Worried About Running Out of Food in the Last Year: Never true    Ran Out of Food in the Last Year: Never true  Transportation Needs: No Transportation Needs (02/28/2024)   PRAPARE - Administrator, Civil Service (Medical): No    Lack of Transportation (Non-Medical): No  Physical Activity: Inactive (02/28/2024)   Exercise Vital Sign    Days of Exercise per Week: 0 days    Minutes of Exercise per Session: 0 min  Stress: No Stress Concern Present (02/28/2024)   [employer] of Occupational Health - Occupational Stress Questionnaire    Feeling of Stress : Not at all  Social Connections: Moderately Isolated (02/28/2024)   Social Connection and Isolation Panel [NHANES]  Frequency of Communication with Friends and Family: More than three times a week    Frequency of Social Gatherings with Friends and Family: Once a week    Attends Religious Services: 1 to 4 times per year    Active Member of [employer] or Organizations: No    Attends Banker Meetings: Never    Marital Status: Divorced  Catering manager Violence: Not At Risk (02/28/2024)   Humiliation, Afraid, Rape, and Kick questionnaire    Fear of Current or Ex-Partner: No    Emotionally Abused: No    Physically Abused: No    Sexually Abused: No    Family History  Problem Relation Age of Onset   Cancer Mother    Asthma Sister    Breast cancer Sister     Anti-infectives: Anti-infectives (From admission, onward)    Start     Dose/Rate Route Frequency Ordered Stop   05/30/24 0000  levofloxacin (LEVAQUIN) 750 MG tablet           05/30/24 1443         Current Outpatient  Medications  Medication Sig Dispense Refill   albuterol  (VENTOLIN  HFA) 108 (90 Base) MCG/ACT inhaler Inhale 2 puffs into the lungs every 4 (four) hours as needed for wheezing. 1 each 3   atorvastatin  (LIPITOR) 40 MG tablet Take 1 tablet (40 mg total) by mouth daily. 90 tablet 3   cholecalciferol (VITAMIN D3) 25 MCG (1000 UT) tablet Take 1,000 Units by mouth daily.     ezetimibe  (ZETIA ) 10 MG tablet Take 1 tablet (10 mg total) by mouth daily. 90 tablet 3   Fluticasone -Umeclidin-Vilant (TRELEGY ELLIPTA ) 100-62.5-25 MCG/ACT AEPB Inhale 1 puff into the lungs daily. 1 each 11   levofloxacin (LEVAQUIN) 750 MG tablet Take 1 po one hour to the procedure. 1 tablet 0   lisinopril -hydrochlorothiazide  (ZESTORETIC ) 20-12.5 MG tablet Take 1 tablet by mouth daily. 90 tablet 3   Omega-3 Fatty Acids (FISH OIL) 1000 MG CAPS Take 1 capsule by mouth daily.     No current facility-administered medications for this visit.     Objective: Vital signs in last 24 hours: BP 107/64   Pulse 87   Intake/Output from previous day: No intake/output data recorded. Intake/Output this shift: @IOTHISSHIFT @   Physical Exam Vitals reviewed.  Constitutional:      Appearance: Normal appearance. He is obese.  Abdominal:     Comments: obese  Neurological:     Mental Status: He is alert.     Lab Results:  Results for orders placed or performed in visit on 05/30/24 (from the past 24 hours)  Urinalysis, Routine w reflex microscopic     Status: None   Collection Time: 05/30/24  2:30 PM  Result Value Ref Range   Specific Gravity, UA 1.010 1.005 - 1.030   pH, UA 6.5 5.0 - 7.5   Color, UA Yellow Yellow   Appearance Ur Clear Clear   Leukocytes,UA Negative Negative   Protein,UA Negative Negative/Trace   Glucose, UA Negative Negative   Ketones, UA Negative Negative   RBC, UA Negative Negative   Bilirubin, UA Negative Negative   Urobilinogen, Ur 0.2 0.2 - 1.0 mg/dL   Nitrite, UA Negative Negative   Microscopic  Examination Comment    Narrative   Performed at:  [address] [facility] [address] [place], [place]  [id number] Lab Director: Liliana Regulus MT, Phone:  [phone]     BMET No results for input(s): "NA", "K", "CL", "CO2", "GLUCOSE", "BUN", "  CREATININE", "CALCIUM " in the last 72 hours. PT/INR No results for input(s): "LABPROT", "INR" in the last 72 hours. ABG No results for input(s): "PHART", "HCO3" in the last 72 hours.  Invalid input(s): "PCO2", "PO2" PSA reviewed.    UA is clear.  Studies/Results: No results found. MR PROSTATE W WO CONTRAST Result Date: 05/14/2024 CLINICAL DATA:  Elevated PSA level.  R97.20 EXAM: MR PROSTATE WITHOUT AND WITH CONTRAST TECHNIQUE: Multiplanar multisequence MRI images were obtained of the pelvis centered about the prostate. Pre and post contrast images were obtained. CONTRAST:  10mL GADAVIST  GADOBUTROL  1 MMOL/ML IV SOLN COMPARISON:  None Available. FINDINGS: Prostate: Region of interest # 1: Very small PI-RADS category 4 lesion of the right anterior peripheral zone and right anterior fibromuscular stroma at the tip of the apex, with focally reduced T2 signal (image 62 series 9) corresponding to reduced ADC map activity and focally restricted diffusion (image 18 of series 7 and 8). This measures 0.14 cc (0.8 by 0.6 by 0.4 cm). No other significant prostate abnormality observed. Volume: 3D volumetric assessment: Prostate volume 31.5 cc (4.9 by 3.7 by 3.6 cm). Transcapsular spread: Absent Seminal vesicle involvement: Absent Neurovascular bundle involvement: Absent Pelvic adenopathy: Absent Bone metastasis: Absent Other findings: No other significant findings. IMPRESSION: 1. Very small PI-RADS category 4 lesion of the right anterior peripheral zone and right anterior fibromuscular stroma at the tip of the apex. Targeting data sent to [facility]. Electronically Signed   By: Freida Jes M.D.   On: 05/14/2024 14:00     Assessment/Plan: Elevated PSA.    He has a P4 right prostate lesion on the MRI and the prostate is 31ml.  He needs an MR fusion biopsy and I reviewed the risks of bleeding, infection and voiding difficulty.  Levaquin sent.   BPH with nocturia.  He has mild LUTS.   Meds ordered this encounter  Medications   levofloxacin (LEVAQUIN) 750 MG tablet    Sig: Take 1 po one hour to the procedure.    Dispense:  1 tablet    Refill:  0     Orders Placed This Encounter  Procedures   Urinalysis, Routine w reflex microscopic   Ambulatory referral to Urology    Referral Priority:   Routine    Referral Type:   Consultation    Referral Reason:   Specialty Services Required    Referred to Provider:   Homero Luster, MD    Requested Specialty:   Urology    Number of Visits Requested:   1     Return for Needs MR fusion prostate biopsy in [facility] Next available.    CC: Dr. Saint Cranker     Homero Luster 05/31/2024 [phone]

## 2024-06-05 NOTE — Telephone Encounter (Signed)
Faxed for records

## 2024-06-24 DIAGNOSIS — N4289 Other specified disorders of prostate: Secondary | ICD-10-CM | POA: Diagnosis not present

## 2024-06-24 DIAGNOSIS — C61 Malignant neoplasm of prostate: Secondary | ICD-10-CM | POA: Diagnosis not present

## 2024-06-24 DIAGNOSIS — R972 Elevated prostate specific antigen [PSA]: Secondary | ICD-10-CM | POA: Diagnosis not present

## 2024-06-25 ENCOUNTER — Encounter: Payer: Self-pay | Admitting: Urology

## 2024-06-25 ENCOUNTER — Telehealth: Payer: Self-pay

## 2024-06-25 NOTE — Telephone Encounter (Signed)
 Called pt to schedule appt per Md Wrenn request pt stated any day but the 29th of this month pt advised that he would be scheduled w/ next available MD and to look for appt on mychart if unable to keep scheduled appt reach out to our office  pt voiced his understanding and acheduled w/ MD Dahlstedt

## 2024-06-25 NOTE — Telephone Encounter (Signed)
-----   Message from Norleen Seltzer sent at 06/25/2024 11:45 AM EDT ----- Regarding: f/u after prostate biopsy Jose Villa wants to remain in [place] for his urologic care.   He has favorable intermediate risk prostate cancer on his recent biopsy and I am going to send him to see Radiation Oncology, but he will need be seen in the next few weeks for further f/u regarding the biopsy and treatment options.  I will have the path faxed.

## 2024-06-26 ENCOUNTER — Other Ambulatory Visit: Payer: Self-pay | Admitting: Urology

## 2024-06-26 DIAGNOSIS — C61 Malignant neoplasm of prostate: Secondary | ICD-10-CM

## 2024-06-26 NOTE — Progress Notes (Signed)
 GU Location of Tumor / Histology: Prostate Ca  If Prostate Cancer, Gleason Score is (3 + 4) and PSA is (5.4 on 03/14/2024)  PSA  4.8 on 06/15/2023 PSA  4.9 on 08/18/2022 PSA  4.3 on 06/02/2022  Jose Villa presented as referral from Dr. Norleen Seltzer [facility]) for consideration of a radiation seed implant for prostate cancer.   Biopsies       05/13/2024 Dr. Norleen Seltzer MR Prostate with/without Contrast CLINICAL DATA:  Elevated PSA level. R97.20   IMPRESSION: 1. Very small PI-RADS category 4 lesion of the right anterior peripheral zone and right anterior fibromuscular stroma at the tip of the apex. Targeting data sent to [facility].    Past/Anticipated interventions by urology, if any:   Dr. Norleen Seltzer   Past/Anticipated interventions by medical oncology, if any: NA  Weight changes, if any:  No  IPSS:  3 SHIM:  0  Bowel/Bladder complaints, if any:  No  Nausea/Vomiting, if any: No  Pain issues, if any:  0/10  SAFETY ISSUES: Prior radiation? No Pacemaker/ICD? No Possible current pregnancy? Male Is the patient on methotrexate? No  Current Complaints / other details:

## 2024-06-30 NOTE — Progress Notes (Signed)
 Radiation Oncology         [phone] ________________________________  Initial Outpatient Consultation  Name: Jose Villa MRN: [id number]  Date: 07/01/2024  DOB: [birth year]  CC:No primary care provider on file.  [name], MD   REFERRING PHYSICIAN: [name], MD  DIAGNOSIS: 73 y.o. gentleman with Stage T1c adenocarcinoma of the prostate with Gleason score of 3+4, and PSA of 5.4.  No diagnosis found.  HISTORY OF PRESENT ILLNESS: Jose Villa is a 73 y.o. male with a diagnosis of prostate cancer. He was noted to have an elevated PSA of 4.3 by his primary care physician, Dr. FERNAND  Accordingly, he was referred for evaluation in urology by Dr. Watt on 08/18/22,  digital rectal examination performed at that time showed no nodules within limitation of prostate being high-riding. A repeat PSA obtained that day showed a drop to 3.9, and they opted for surveillance. Unfortunately, his PSA rose again to 4.8 in 05/2023 and 5.4 in 02/2024. He was referred back to Dr. Watt on 03/21/24, and a repeat DRE at that time was stable. He underwent a prostate MRI on 05/13/24 showing: very small PI-RADS 4 lesion of right anterior peripheral zone and right anterior fibromuscular stroma at tip of apex. The patient proceeded to MRI fusion biopsy of the prostate on 06/24/24.  The prostate volume measured 26 cc.  Out of 15 core biopsies, 8 were positive.  The maximum Gleason score was 3+4, and this was seen in right mid, right base, left apex, and left mid. Additionally, Gleason 3+3 was seen in right mid lateral and all three samples from the MRI ROI (with perineural invasion).  The patient reviewed the biopsy results with his urologist and he has kindly been referred today for discussion of potential radiation treatment options.   PREVIOUS RADIATION THERAPY: No  PAST MEDICAL HISTORY:  Past Medical History:  Diagnosis Date   Closed fracture of lateral malleolus 12/02/2010   Qualifier: Diagnosis of  By: Margrette MD,  T[place]     COPD (chronic obstructive pulmonary disease) (HCC)    Hearing loss, central       PAST SURGICAL HISTORY: Past Surgical History:  Procedure Laterality Date   FRACTURE SURGERY     right ankle    FAMILY HISTORY:  Family History  Problem Relation Age of Onset   Cancer Mother    Asthma Sister    Breast cancer Sister     SOCIAL HISTORY:  Social History   Socioeconomic History   Marital status: Single    Spouse name: Not on file   Number of children: 1   Years of education: Not on file   Highest education level: Associate degree: occupational, Scientist, product/process development, or vocational program  Occupational History   Occupation: retired  Tobacco Use   Smoking status: Former    Current packs/day: 0.00    Types: Cigarettes    Quit date: 12/26/2016    Years since quitting: 7.5   Smokeless tobacco: Never  Vaping Use   Vaping status: Never Used  Substance and Sexual Activity   Alcohol use: Not Currently    Comment: 2 beers per day; one 12 pack per week   Drug use: No   Sexual activity: Not Currently  Other Topics Concern   Not on file  Social History Narrative   Lives home alone.    Social Drivers of Corporate investment banker Strain: Low Risk  (02/28/2024)   Overall Financial Resource Strain (CARDIA)    Difficulty of Paying Living  Expenses: Not hard at all  Food Insecurity: No Food Insecurity (02/28/2024)   Hunger Vital Sign    Worried About Running Out of Food in the Last Year: Never true    Ran Out of Food in the Last Year: Never true  Transportation Needs: No Transportation Needs (02/28/2024)   PRAPARE - Administrator, Civil Service (Medical): No    Lack of Transportation (Non-Medical): No  Physical Activity: Inactive (02/28/2024)   Exercise Vital Sign    Days of Exercise per Week: 0 days    Minutes of Exercise per Session: 0 min  Stress: No Stress Concern Present (02/28/2024)   [employer] of Occupational Health - Occupational Stress Questionnaire     Feeling of Stress : Not at all  Social Connections: Moderately Isolated (02/28/2024)   Social Connection and Isolation Panel    Frequency of Communication with Friends and Family: More than three times a week    Frequency of Social Gatherings with Friends and Family: Once a week    Attends Religious Services: 1 to 4 times per year    Active Member of [employer] or Organizations: No    Attends Banker Meetings: Never    Marital Status: Divorced  Catering manager Violence: Not At Risk (02/28/2024)   Humiliation, Afraid, Rape, and Kick questionnaire    Fear of Current or Ex-Partner: No    Emotionally Abused: No    Physically Abused: No    Sexually Abused: No    ALLERGIES: Coconut oil fatty acid diethanolamide [cocamide dea]  MEDICATIONS:  Current Outpatient Medications  Medication Sig Dispense Refill   albuterol  (VENTOLIN  HFA) 108 (90 Base) MCG/ACT inhaler Inhale 2 puffs into the lungs every 4 (four) hours as needed for wheezing. 1 each 3   atorvastatin  (LIPITOR) 40 MG tablet Take 1 tablet (40 mg total) by mouth daily. 90 tablet 3   cholecalciferol (VITAMIN D3) 25 MCG (1000 UT) tablet Take 1,000 Units by mouth daily.     ezetimibe  (ZETIA ) 10 MG tablet Take 1 tablet (10 mg total) by mouth daily. 90 tablet 3   Fluticasone -Umeclidin-Vilant (TRELEGY ELLIPTA ) 100-62.5-25 MCG/ACT AEPB Inhale 1 puff into the lungs daily. 1 each 11   lisinopril -hydrochlorothiazide  (ZESTORETIC ) 20-12.5 MG tablet Take 1 tablet by mouth daily. 90 tablet 3   Omega-3 Fatty Acids (FISH OIL) 1000 MG CAPS Take 1 capsule by mouth daily.     No current facility-administered medications for this encounter.    REVIEW OF SYSTEMS:  On review of systems, the patient reports that he is doing well overall. He denies any chest pain, shortness of breath, cough, fevers, chills, night sweats, unintended weight changes. He denies any bowel disturbances, and denies abdominal pain, nausea or vomiting. He denies any new  musculoskeletal or joint aches or pains. His IPSS was ***, indicating *** urinary symptoms. His SHIM was ***, indicating he {does not have/has mild/moderate/severe} erectile dysfunction. A complete review of systems is obtained and is otherwise negative.    PHYSICAL EXAM:  Wt Readings from Last 3 Encounters:  02/28/24 248 lb (112.5 kg)  01/22/24 248 lb 9.6 oz (112.8 kg)  07/21/23 244 lb 9.6 oz (110.9 kg)   Temp Readings from Last 3 Encounters:  04/21/21 98.3 F (36.8 C)  03/17/21 98.2 F (36.8 C)  02/17/21 98.6 F (37 C)   BP Readings from Last 3 Encounters:  05/30/24 107/64  03/21/24 121/77  02/28/24 110/67   Pulse Readings from Last 3 Encounters:  05/30/24 87  03/21/24 (!) 101  01/22/24 78    /10  In general this is a well appearing *** male in no acute distress. He's alert and oriented x4 and appropriate throughout the examination. Cardiopulmonary assessment is negative for acute distress, and he exhibits normal effort.     KPS = ***  100 - Normal; no complaints; no evidence of disease. 90   - Able to carry on normal activity; minor signs or symptoms of disease. 80   - Normal activity with effort; some signs or symptoms of disease. 52   - Cares for self; unable to carry on normal activity or to do active work. 60   - Requires occasional assistance, but is able to care for most of his personal needs. 50   - Requires considerable assistance and frequent medical care. 40   - Disabled; requires special care and assistance. 30   - Severely disabled; hospital admission is indicated although death not imminent. 20   - Very sick; hospital admission necessary; active supportive treatment necessary. 10   - Moribund; fatal processes progressing rapidly. 0     - Dead  Karnofsky DA, Abelmann WH, Craver LS and Burchenal [facility] [phone]) The use of the nitrogen mustards in the palliative treatment of carcinoma: with particular reference to bronchogenic carcinoma Cancer 1  634-56  LABORATORY DATA:  Lab Results  Component Value Date   WBC 10.5 01/22/2024   HGB 16.1 01/22/2024   HCT 49.1 01/22/2024   MCV 98 (H) 01/22/2024   PLT 246 01/22/2024   Lab Results  Component Value Date   NA 139 01/22/2024   K 4.6 01/22/2024   CL 95 (L) 01/22/2024   CO2 26 01/22/2024   Lab Results  Component Value Date   ALT 16 01/22/2024   AST 20 01/22/2024   ALKPHOS 99 01/22/2024   BILITOT 0.3 01/22/2024     RADIOGRAPHY: No results found.    IMPRESSION/PLAN: 1. 73 y.o. gentleman with Stage T1c adenocarcinoma of the prostate with Gleason Score of 3+4, and PSA of 5.4. We discussed the patient's workup and outlined the nature of prostate cancer in this setting. The patient's T stage, Gleason's score, and PSA put him into the favorable intermediate risk group. Accordingly, he is eligible for a variety of potential treatment options including brachytherapy, 5.5 weeks of external radiation, or prostatectomy. We discussed the available radiation techniques, and focused on the details and logistics of delivery. We discussed and outlined the risks, benefits, short and long-term effects associated with radiotherapy and compared and contrasted these with prostatectomy. We discussed the role of SpaceOAR gel in reducing the rectal toxicity associated with radiotherapy. He appears to have a good understanding of his disease and our treatment recommendations which are of curative intent.  He was encouraged to ask questions that were answered to his stated satisfaction.  At the conclusion of our conversation, the patient is interested in moving forward with ***.  We personally spent *** minutes in this encounter including chart review, reviewing radiological studies, meeting face-to-face with the patient, entering orders and completing documentation.   ------------------------------------------------   Donnice Barge, MD [facility]  Radiation Oncology Direct Dial: [phone]   Fax: [phone] [email or web address]  Skype  [email or web address]   This document serves as a record of services personally performed by Donnice Barge, MD. It was created on his behalf by Izetta Neither, a trained medical scribe. The creation of this record is based on the scribe's personal observations and the provider's statements to them. This  document has been checked and approved by the attending provider.

## 2024-07-01 ENCOUNTER — Ambulatory Visit
Admission: RE | Admit: 2024-07-01 | Discharge: 2024-07-01 | Disposition: A | Discharge status: Home / Self Care | Source: Ambulatory Visit | Attending: Radiation Oncology | Admitting: Radiation Oncology

## 2024-07-01 ENCOUNTER — Encounter: Payer: Self-pay | Admitting: Radiation Oncology

## 2024-07-01 VITALS — BP 136/84 | HR 91 | Temp 97.9°F | Resp 20 | Ht 71.0 in | Wt 245.0 lb

## 2024-07-01 DIAGNOSIS — Z79899 Other long term (current) drug therapy: Secondary | ICD-10-CM | POA: Diagnosis not present

## 2024-07-01 DIAGNOSIS — Z87891 Personal history of nicotine dependence: Secondary | ICD-10-CM | POA: Diagnosis not present

## 2024-07-01 DIAGNOSIS — C61 Malignant neoplasm of prostate: Secondary | ICD-10-CM | POA: Insufficient documentation

## 2024-07-01 DIAGNOSIS — R972 Elevated prostate specific antigen [PSA]: Secondary | ICD-10-CM | POA: Diagnosis not present

## 2024-07-01 DIAGNOSIS — J449 Chronic obstructive pulmonary disease, unspecified: Secondary | ICD-10-CM | POA: Diagnosis not present

## 2024-07-01 DIAGNOSIS — Z191 Hormone sensitive malignancy status: Secondary | ICD-10-CM | POA: Diagnosis not present

## 2024-07-01 HISTORY — DX: Elevated prostate specific antigen (PSA): R97.20

## 2024-07-03 ENCOUNTER — Other Ambulatory Visit: Payer: Self-pay | Admitting: Urology

## 2024-07-03 ENCOUNTER — Telehealth: Payer: Self-pay | Admitting: *Deleted

## 2024-07-03 NOTE — Telephone Encounter (Signed)
 CALLED PATIENT TO INFORM OF PRE-SEED APPTS. AND IMPLANT DATE, SPOKE WITH PATIENT AND HE IS AWARE OF THESE APPTS.

## 2024-07-08 NOTE — Progress Notes (Signed)
 Introduced myself to the patient as the prostate nurse navigator.  No barriers to care identified at this time.  He is here to discuss his radiation treatment options.  I gave him my business card and asked him to call me with questions or concerns.  Verbalized understanding.  ?

## 2024-07-11 ENCOUNTER — Encounter: Payer: Self-pay | Admitting: Urology

## 2024-07-11 DIAGNOSIS — C61 Malignant neoplasm of prostate: Secondary | ICD-10-CM | POA: Insufficient documentation

## 2024-07-15 NOTE — Progress Notes (Signed)
 History of Present Illness: 73 year old male, previously been followed by Dr. Norleen Seltzer.  He is here today to discuss his new diagnosis of prostate cancer.  He underwent fusion biopsy on June 14, 2024.  At that time, PSA was 5.4.  He had a PI-RADS 4 lesion in the right prostate.  Prostatic volume was 31 mL on that MRI.  On fusion biopsy, all 3 cores from the region of interest revealed GS 3+3 pattern.  60, 50 and 40% of cores were involved with cancer with perineural invasion seen.  On the systematic biopsy, 5/12 cores revealed adenocarcinoma.  4 cores (right base medial, right mid medial, left mid medial, left apex medial) revealed GS 3+4 pattern 30, 60, 10 and 40% of cores respectively. 1 core (right mid lateral) revealed GS 3+3 pattern and 20% of core.  He is scheduled for I-125 brachytherapy with SpaceOAR placement on September 16.  Past Medical History:  Diagnosis Date   Closed fracture of lateral malleolus 12/02/2010   Qualifier: Diagnosis of  By: Margrette MD, Taft     COPD (chronic obstructive pulmonary disease) (HCC)    Elevated PSA    Hearing loss, central     Past Surgical History:  Procedure Laterality Date   FRACTURE SURGERY     right ankle   PROSTATE BIOPSY      Home Medications:  Allergies as of 07/16/2024       Reactions   Coconut Oil Fatty Acid Diethanolamide [cocamide Dea] Hives        Medication List        Accurate as of July 15, 2024  9:56 AM. If you have any questions, ask your nurse or doctor.          albuterol  108 (90 Base) MCG/ACT inhaler Commonly known as: Ventolin  HFA Inhale 2 puffs into the lungs every 4 (four) hours as needed for wheezing.   atorvastatin  40 MG tablet Commonly known as: LIPITOR Take 1 tablet (40 mg total) by mouth daily.   cholecalciferol 25 MCG (1000 UNIT) tablet Commonly known as: VITAMIN D3 Take 1,000 Units by mouth daily.   ezetimibe  10 MG tablet Commonly known as: ZETIA  Take 1 tablet (10 mg total) by  mouth daily.   Fish Oil 1000 MG Caps Take 1 capsule by mouth daily.   lisinopril -hydrochlorothiazide  20-12.5 MG tablet Commonly known as: Zestoretic  Take 1 tablet by mouth daily.   Trelegy Ellipta  100-62.5-25 MCG/ACT Aepb Generic drug: Fluticasone -Umeclidin-Vilant Inhale 1 puff into the lungs daily.        Allergies:  Allergies  Allergen Reactions   Coconut Oil Fatty Acid Diethanolamide [Cocamide Dea] Hives    Family History  Problem Relation Age of Onset   Cancer Mother    Asthma Sister    Breast cancer Sister     Social History:  reports that he quit smoking about 7 years ago. His smoking use included cigarettes. He started smoking about 57 years ago. He has a 75 pack-year smoking history. He has never used smokeless tobacco. He reports current alcohol use. He reports that he does not use drugs.  ROS: A complete review of systems was performed.  All systems are negative except for pertinent findings as noted.  Physical Exam:  Vital signs in last 24 hours: There were no vitals taken for this visit. Constitutional:  Alert and oriented, No acute distress Cardiovascular: Regular rate  Respiratory: Normal respiratory effort Lymphatic: No lymphadenopathy Neurologic: Grossly intact, no focal deficits Psychiatric: Normal mood and affect  I have reviewed prior pt notes from Dr. Watt  I have reviewed notes from radiation oncology  I have reviewed urinalysis results  I have independently reviewed prior imaging--prostate ultrasound, MRI  I have reviewed prior PSA and pathology results     Impression/Assessment:  Grade group 2 prostate cancer, scheduled for I-125 brachytherapy/SpaceOAR in September  Plan:  Reviewed the patient's pathology with him  I also reviewed the procedure of brachytherapy.  All questions were answered.  Risks and complications were discussed  I we will see the patient back in about 5 weeks for his preoperative visit.

## 2024-07-16 ENCOUNTER — Ambulatory Visit: Admitting: Urology

## 2024-07-16 VITALS — BP 100/64 | HR 72

## 2024-07-16 DIAGNOSIS — C61 Malignant neoplasm of prostate: Secondary | ICD-10-CM

## 2024-07-23 ENCOUNTER — Ambulatory Visit (INDEPENDENT_AMBULATORY_CARE_PROVIDER_SITE_OTHER): Payer: Medicare Other

## 2024-07-23 VITALS — BP 105/69 | HR 91 | Ht 71.0 in | Wt 244.0 lb

## 2024-07-23 DIAGNOSIS — E782 Mixed hyperlipidemia: Secondary | ICD-10-CM | POA: Diagnosis not present

## 2024-07-23 DIAGNOSIS — E559 Vitamin D deficiency, unspecified: Secondary | ICD-10-CM

## 2024-07-23 DIAGNOSIS — E118 Type 2 diabetes mellitus with unspecified complications: Secondary | ICD-10-CM | POA: Diagnosis not present

## 2024-07-23 DIAGNOSIS — R7989 Other specified abnormal findings of blood chemistry: Secondary | ICD-10-CM

## 2024-07-23 DIAGNOSIS — Z0001 Encounter for general adult medical examination with abnormal findings: Secondary | ICD-10-CM

## 2024-07-23 NOTE — Progress Notes (Signed)
 Complete physical exam  Patient: Jose Villa   DOB: 10-08-1951   73 y.o. Male  MRN: [id number]  Subjective:    Chief Complaint  Patient presents with   Annual Exam    Walt Geathers Schaper is a 73 y.o. male who presents today for a complete physical exam. He reports consuming a general diet. The patient does not participate in regular exercise at present. He generally feels fairly well. He reports sleeping fairly well. He does not have additional problems to discuss today.    Most recent fall risk assessment:    02/28/2024    9:18 AM  Fall Risk   Falls in the past year? 0  Number falls in past yr: 0  Injury with Fall? 0  Risk for fall due to : No Fall Risks  Follow up Falls prevention discussed;Falls evaluation completed     Most recent depression screenings:    07/23/2024    1:00 PM 07/01/2024    8:17 AM  PHQ 2/9 Scores  PHQ - 2 Score 0 0  PHQ- 9 Score 0       Patient Active Problem List   Diagnosis Date Noted   Vitamin D  deficiency 07/23/2024   Malignant neoplasm of prostate (HCC) 07/11/2024   Controlled diabetes mellitus type 2 with complications (HCC) 06/08/2022   Elevated PSA, less than 10 ng/ml 06/08/2022   Annual physical exam 06/08/2022   Elevated TSH 06/08/2022   Need for varicella vaccine 06/08/2022   Hypertension, essential 02/17/2021   Hyperlipidemia 02/17/2021   Screening due 02/03/2021   Obesity 09/26/2011   Encounter to establish care 09/26/2011   LOSS, CENTRAL HEARING 11/28/2006   COPD (chronic obstructive pulmonary disease) (HCC) 11/28/2006      Patient Care Team: Melvenia Manus BRAVO, MD as PCP - General (Internal Medicine) Vertell Pont, RN as Oncology Nurse Navigator   Outpatient Medications Prior to Visit  Medication Sig   albuterol  (VENTOLIN  HFA) 108 (90 Base) MCG/ACT inhaler Inhale 2 puffs into the lungs every 4 (four) hours as needed for wheezing.   atorvastatin  (LIPITOR) 40 MG tablet Take 1 tablet (40 mg total) by mouth daily.    cholecalciferol (VITAMIN D3) 25 MCG (1000 UT) tablet Take 1,000 Units by mouth daily.   ezetimibe  (ZETIA ) 10 MG tablet Take 1 tablet (10 mg total) by mouth daily.   Fluticasone -Umeclidin-Vilant (TRELEGY ELLIPTA ) 100-62.5-25 MCG/ACT AEPB Inhale 1 puff into the lungs daily.   lisinopril -hydrochlorothiazide  (ZESTORETIC ) 20-12.5 MG tablet Take 1 tablet by mouth daily.   Omega-3 Fatty Acids (FISH OIL) 1000 MG CAPS Take 1 capsule by mouth daily.   No facility-administered medications prior to visit.    ROS      Objective:     BP 105/69   Pulse 91   Ht 5' 11 (1.803 m)   Wt 244 lb (110.7 kg)   SpO2 (!) 86%   BMI 34.03 kg/m  BP Readings from Last 3 Encounters:  07/23/24 105/69  07/16/24 100/64  07/01/24 136/84   Wt Readings from Last 3 Encounters:  07/23/24 244 lb (110.7 kg)  07/01/24 245 lb (111.1 kg)  02/28/24 248 lb (112.5 kg)     Physical Exam Vitals and nursing note reviewed.  Constitutional:      Appearance: Normal appearance. He is obese.  HENT:     Head: Normocephalic.     Right Ear: Tympanic membrane, ear canal and external ear normal.     Left Ear: Tympanic membrane, ear canal and external ear normal.  Nose: Nose normal.     Mouth/Throat:     Mouth: Mucous membranes are moist.     Pharynx: Oropharynx is clear.  Cardiovascular:     Rate and Rhythm: Normal rate and regular rhythm.  Pulmonary:     Effort: Pulmonary effort is normal.     Breath sounds: Normal breath sounds.  Musculoskeletal:     Cervical back: Normal range of motion and neck supple.  Skin:    General: Skin is warm and dry.  Neurological:     Mental Status: He is alert and oriented to person, place, and time.  Psychiatric:        Mood and Affect: Mood normal.        Thought Content: Thought content normal.           Assessment & Plan:    Routine Health Maintenance and Physical Exam  Immunization History  Administered Date(s) Administered   Influenza-Unspecified 01/03/2022,  12/31/2023   Moderna Covid-19 Vaccine Bivalent Booster 71yrs & up 01/03/2022   Moderna Sars-Covid-2 Vaccination 02/24/2020, 03/26/2020   PNEUMOCOCCAL CONJUGATE-20 01/03/2022   Tdap 03/26/2020   Zoster Recombinant(Shingrix ) 01/03/2022, 06/08/2022    Health Maintenance  Topic Date Due   OPHTHALMOLOGY EXAM  Never done   Lung Cancer Screening  Never done   INFLUENZA VACCINE  07/26/2024   Diabetic kidney evaluation - Urine ACR  01/21/2025   FOOT EXAM  01/21/2025   HEMOGLOBIN A1C  01/23/2025   Medicare Annual Wellness (AWV)  02/27/2025   Fecal DNA (Cologuard)  06/13/2025   Diabetic kidney evaluation - eGFR measurement  07/23/2025   DTaP/Tdap/Td (2 - Td or Tdap) 03/26/2030   Pneumococcal Vaccine: 50+ Years  Completed   Hepatitis C Screening  Completed   Zoster Vaccines- Shingrix   Completed   Hepatitis B Vaccines  Aged Out   HPV VACCINES  Aged Out   Meningococcal B Vaccine  Aged Out   Colonoscopy  Discontinued   COVID-19 Vaccine  Discontinued   Discussed health benefits of physical activity, and encouraged him to engage in regular exercise appropriate for his age and condition.  Problem List Items Addressed This Visit       Endocrine   Controlled diabetes mellitus type 2 with complications (HCC)   Repeat A1c and urine microalbumin/creatinine ratio ordered today.        Relevant Orders   CMP14+EGFR (Completed)   HgB A1c (Completed)     Other   Hyperlipidemia   Repeat lipid panel ordered today.  Currently on atorvastatin  40 and Zetia  10 mg.  Follow-up according to lab results      Relevant Orders   CMP14+EGFR (Completed)   Lipid Profile (Completed)   Elevated TSH   TSH, T4 today Patient denies signs of hypothyroidism today       Relevant Orders   TSH + free T4 (Completed)   Vitamin D  deficiency   Recheck vitamin D  level.      Relevant Orders   Vitamin D  (25 hydroxy) (Completed)   Other Visit Diagnoses       Encounter for preventative adult health care exam  with abnormal findings    -  Primary   Unremarkable exam today.  Will check fasting labs.  Follow-up according to lab results.   Relevant Orders   CMP14+EGFR (Completed)   Lipid Profile (Completed)   CBC with Differential/Platelet (Completed)   HgB A1c (Completed)   TSH + free T4 (Completed)   Vitamin D  (25 hydroxy) (Completed)  No follow-ups on file.     Leita Longs, FNP

## 2024-07-24 LAB — CBC WITH DIFFERENTIAL/PLATELET
Basophils Absolute: 0.1 x10E3/uL (ref 0.0–0.2)
Basos: 1 %
EOS (ABSOLUTE): 0.3 x10E3/uL (ref 0.0–0.4)
Eos: 3 %
Hematocrit: 48.5 % (ref 37.5–51.0)
Hemoglobin: 15.7 g/dL (ref 13.0–17.7)
Immature Grans (Abs): 0.1 x10E3/uL (ref 0.0–0.1)
Immature Granulocytes: 1 %
Lymphocytes Absolute: 1.9 x10E3/uL (ref 0.7–3.1)
Lymphs: 17 %
MCH: 31.8 pg (ref 26.6–33.0)
MCHC: 32.4 g/dL (ref 31.5–35.7)
MCV: 98 fL — ABNORMAL HIGH (ref 79–97)
Monocytes Absolute: 1.2 x10E3/uL — ABNORMAL HIGH (ref 0.1–0.9)
Monocytes: 11 %
Neutrophils Absolute: 7.3 x10E3/uL — ABNORMAL HIGH (ref 1.4–7.0)
Neutrophils: 67 %
Platelets: 261 x10E3/uL (ref 150–450)
RBC: 4.94 x10E6/uL (ref 4.14–5.80)
RDW: 12.5 % (ref 11.6–15.4)
WBC: 11 x10E3/uL — ABNORMAL HIGH (ref 3.4–10.8)

## 2024-07-24 LAB — CMP14+EGFR
ALT: 16 IU/L (ref 0–44)
AST: 21 IU/L (ref 0–40)
Albumin: 4.4 g/dL (ref 3.8–4.8)
Alkaline Phosphatase: 108 IU/L (ref 44–121)
BUN/Creatinine Ratio: 18 (ref 10–24)
BUN: 24 mg/dL (ref 8–27)
Bilirubin Total: 0.5 mg/dL (ref 0.0–1.2)
CO2: 24 mmol/L (ref 20–29)
Calcium: 11.2 mg/dL — ABNORMAL HIGH (ref 8.6–10.2)
Chloride: 95 mmol/L — ABNORMAL LOW (ref 96–106)
Creatinine, Ser: 1.37 mg/dL — ABNORMAL HIGH (ref 0.76–1.27)
Globulin, Total: 2.8 g/dL (ref 1.5–4.5)
Glucose: 85 mg/dL (ref 70–99)
Potassium: 4.4 mmol/L (ref 3.5–5.2)
Sodium: 136 mmol/L (ref 134–144)
Total Protein: 7.2 g/dL (ref 6.0–8.5)
eGFR: 55 mL/min/1.73 — ABNORMAL LOW (ref >59)

## 2024-07-24 LAB — LIPID PANEL
Chol/HDL Ratio: 3.8 ratio (ref 0.0–5.0)
Cholesterol, Total: 133 mg/dL (ref 100–199)
HDL: 35 mg/dL — ABNORMAL LOW (ref >39)
LDL Chol Calc (NIH): 68 mg/dL (ref 0–99)
Triglycerides: 178 mg/dL — ABNORMAL HIGH (ref 0–149)
VLDL Cholesterol Cal: 30 mg/dL (ref 5–40)

## 2024-07-24 LAB — HEMOGLOBIN A1C
Est. average glucose Bld gHb Est-mCnc: 146 mg/dL
Hgb A1c MFr Bld: 6.7 % — ABNORMAL HIGH (ref 4.8–5.6)

## 2024-07-24 LAB — TSH+FREE T4
Free T4: 0.94 ng/dL (ref 0.82–1.77)
TSH: 4.8 u[IU]/mL — ABNORMAL HIGH (ref 0.450–4.500)

## 2024-07-24 LAB — VITAMIN D 25 HYDROXY (VIT D DEFICIENCY, FRACTURES): Vit D, 25-Hydroxy: 27.5 ng/mL — ABNORMAL LOW (ref 30.0–100.0)

## 2024-07-29 ENCOUNTER — Ambulatory Visit: Payer: Self-pay

## 2024-07-29 ENCOUNTER — Other Ambulatory Visit: Payer: Self-pay

## 2024-07-29 DIAGNOSIS — E559 Vitamin D deficiency, unspecified: Secondary | ICD-10-CM

## 2024-07-29 MED ORDER — VITAMIN D (ERGOCALCIFEROL) 1.25 MG (50000 UNIT) PO CAPS: 50000.0000 [IU] | ORAL_CAPSULE | ORAL | 5 refills | Status: AC

## 2024-07-29 NOTE — Assessment & Plan Note (Signed)
Recheck vitamin D level 

## 2024-07-29 NOTE — Assessment & Plan Note (Signed)
Repeat A1c and urine microalbumin/creatinine ratio ordered today.

## 2024-07-29 NOTE — Assessment & Plan Note (Signed)
 TSH, T4 today Patient denies signs of hypothyroidism today

## 2024-07-29 NOTE — Assessment & Plan Note (Signed)
 Repeat lipid panel ordered today.  Currently on atorvastatin  40 and Zetia  10 mg.  Follow-up according to lab results

## 2024-08-03 LAB — CA+CREAT+P+PTH INTACT
Calcium: 11.1 mg/dL — ABNORMAL HIGH (ref 8.6–10.2)
Creatinine, Ser: 1.13 mg/dL (ref 0.76–1.27)
PTH: 10 pg/mL — ABNORMAL LOW (ref 15–65)
Phosphorus: 2.9 mg/dL (ref 2.8–4.1)
eGFR: 69 mL/min/1.73 (ref >59)

## 2024-08-13 NOTE — Patient Instructions (Signed)
 SURGICAL WAITING ROOM VISITATION  Patients having surgery or a procedure may have no more than 2 support people in the waiting area - these visitors may rotate.    Children under the age of 66 must have an adult with them who is not the patient.  Visitors with respiratory illnesses are discouraged from visiting and should remain at home.  If the patient needs to stay at the hospital during part of their recovery, the visitor guidelines for inpatient rooms apply. Pre-op nurse will coordinate an appropriate time for 1 support person to accompany patient in pre-op.  This support person may not rotate.    Please refer to the [facility] website for the visitor guidelines for Inpatients (after your surgery is over and you are in a regular room).       Your procedure is scheduled on: 09/10/24   Report to [facility] Main Entrance    Report to admitting at 5:15 AM   Call this number if you have problems the morning of surgery [phone]   Do not eat food or drink any liquids:After Midnight.but may have sips of water with meds.      FOLLOW BOWEL PREP AND ANY ADDITIONAL PRE OP INSTRUCTIONS YOU RECEIVED FROM YOUR SURGEON'S OFFICE!!!     Oral Hygiene is also important to reduce your risk of infection.                                    Remember - BRUSH YOUR TEETH THE MORNING OF SURGERY WITH YOUR REGULAR TOOTHPASTE  DENTURES WILL BE REMOVED PRIOR TO SURGERY PLEASE DO NOT APPLY Poly grip OR ADHESIVES!!!   Stop all vitamins and herbal supplements 7 days before surgery.   Take these medicines the morning of surgery with A SIP OF WATER: Atorvastatin , Ezetimibe (zetia ), Inhalers.              Do not take Zestoretic (lisinopril /hydrochlorothiazide ) the morning of surgery.  DO NOT TAKE ANY ORAL DIABETIC MEDICATIONS DAY OF YOUR SURGERY             You may not have any metal on your body including hair pins, jewelry, and body piercing             Do not wear make-up, lotions,  powders, perfumes/cologne, or deodorant              Men may shave face and neck.   Do not bring valuables to the hospital. [facility] IS NOT             RESPONSIBLE   FOR VALUABLES.   Contacts, glasses, dentures or bridgework may not be worn into surgery.  DO NOT BRING YOUR HOME MEDICATIONS TO THE HOSPITAL. PHARMACY WILL DISPENSE MEDICATIONS LISTED ON YOUR MEDICATION LIST TO YOU DURING YOUR ADMISSION IN THE HOSPITAL!    Patients discharged on the day of surgery will not be allowed to drive home.  Someone NEEDS to stay with you for the first 24 hours after anesthesia.   Special Instructions: Bring a copy of your healthcare power of attorney and living will documents the day of surgery if you haven't scanned them before.              Please read over the following fact sheets you were given: IF YOU HAVE QUESTIONS ABOUT YOUR PRE-OP INSTRUCTIONS PLEASE CALL [phone] Verneita   If you received a COVID test during your pre-op  visit  it is requested that you wear a mask when out in public, stay away from anyone that may not be feeling well and notify your surgeon if you develop symptoms. If you test positive for Covid or have been in contact with anyone that has tested positive in the last 10 days please notify you surgeon.    [facility] - Preparing for Surgery Before surgery, you can play an important role.  Because skin is not sterile, your skin needs to be as free of germs as possible.  You can reduce the number of germs on your skin by washing with CHG (chlorahexidine gluconate) soap before surgery.  CHG is an antiseptic cleaner which kills germs and bonds with the skin to continue killing germs even after washing. Please DO NOT use if you have an allergy to CHG or antibacterial soaps.  If your skin becomes reddened/irritated stop using the CHG and inform your nurse when you arrive at Short Stay. Do not shave (including legs and underarms) for at least 48 hours prior to the first CHG shower.   You may shave your face/neck.  Please follow these instructions carefully:  1.  Shower with CHG Soap the night before surgery and the  morning of surgery.  2.  If you choose to wash your hair, wash your hair first as usual with your normal  shampoo.  3.  After you shampoo, rinse your hair and body thoroughly to remove the shampoo.                             4.  Use CHG as you would any other liquid soap.  You can apply chg directly to the skin and wash.  Gently with a scrungie or clean washcloth.  5.  Apply the CHG Soap to your body ONLY FROM THE NECK DOWN.   Do   not use on face/ open                           Wound or open sores. Avoid contact with eyes, ears mouth and   genitals (private parts).                       Wash face,  Genitals (private parts) with your normal soap.             6.  Wash thoroughly, paying special attention to the area where your    surgery  will be performed.  7.  Thoroughly rinse your body with warm water from the neck down.  8.  DO NOT shower/wash with your normal soap after using and rinsing off the CHG Soap.                9.  Pat yourself dry with a clean towel.            10.  Wear clean pajamas.            11.  Place clean sheets on your bed the night of your first shower and do not  sleep with pets. Day of Surgery : Do not apply any lotions/deodorants the morning of surgery.  Please wear clean clothes to the hospital/surgery center.  FAILURE TO FOLLOW THESE INSTRUCTIONS MAY RESULT IN THE CANCELLATION OF YOUR SURGERY  PATIENT SIGNATURE_________________________________  NURSE SIGNATURE__________________________________  ________________________________________________________________________

## 2024-08-13 NOTE — Progress Notes (Signed)
 COVID Vaccine received:  []  No [x]  Yes Date of any COVID positive Test in last 90 days: no PCP - Leita Longs FNP Cardiologist -   Chest x-ray -  EKG - 08/15/24 Epic  Stress Test -  ECHO -  Cardiac Cath -   Bowel Prep - [x]  No  []   Yes ______  Pacemaker / ICD device [x]  No []  Yes   Spinal Cord Stimulator:[x]  No []  Yes       History of Sleep Apnea? [x]  No []  Yes   CPAP used?- [x]  No []  Yes    Does the patient monitor blood sugar?          [x]  No []  Yes  []  N/A  Patient has: []  NO Hx DM   []  Pre-DM                 []  DM1  [x]   DM2 Does patient have a [employer] or Dexacom? [x]  No []  Yes   Fasting Blood Sugar Ranges-  Checks Blood Sugar ____0_ times a day  GLP1 agonist / usual dose - no GLP1 instructions:  SGLT-2 inhibitors / usual dose - no SGLT-2 instructions:   Blood Thinner / Instructions:no Aspirin Instructions:no  Comments:   Activity level: Patient is able  to climb a flight of stairs without difficulty; [x]  No CP  [x]  No SOB,  Patient can perform ADLs without assistance.   Anesthesia review: COPD,HTN, DM, L Ant. Fascicular block  Patient denies shortness of breath, fever, cough and chest pain at PAT appointment.  Patient verbalized understanding and agreement to the Pre-Surgical Instructions that were given to them at this PAT appointment. Patient was also educated of the need to review these PAT instructions again prior to his/her surgery.I reviewed the appropriate phone numbers to call if they have any and questions or concerns.

## 2024-08-14 ENCOUNTER — Telehealth: Payer: Self-pay | Admitting: *Deleted

## 2024-08-14 NOTE — Progress Notes (Signed)
 Pre-seed nursing interview for a diagnosis of ***  Patient identity verified x2.   Patient states issues as follows...  -Pain: *** -Fatigue: *** -Abdomen: *** -Groin: *** -Urinary: *** -Bowels: *** -Appetite: ***  Patient denies all other related issues at this time.  Meaningful use complete.  Urinary Management medication(s)- *** Urology appointment date- ***, with Dr. PIERRETTE at ***  No vitals needed for this visit.  This concludes the interaction.  NURSE REMINDER: START 'PRE-SEED' EDUCATION VIDEO AT 4:25 mins.

## 2024-08-14 NOTE — Progress Notes (Signed)
  Radiation Oncology         [phone] ________________________________  Name: Jose Villa MRN: [id number]  Date: 08/15/2024  DOB: [birth year]  SIMULATION AND TREATMENT PLANNING NOTE PUBIC ARCH STUDY Definitive Seed Implant as Monotherapy  RR:Ylzwpwx, Leita, FNP  [name], MD  DIAGNOSIS: 73 y.o. gentleman with Stage T1c adenocarcinoma of the prostate with Gleason score of 3+4, and PSA of 5.4.   Oncology History  Malignant neoplasm of prostate (HCC)  06/24/2024 Cancer Staging   Staging form: Prostate, AJCC 8th Edition - Clinical stage from 06/24/2024: Stage IIB (cT1c, cN0, cM0, PSA: 5.4, Grade Group: 2) - Signed by Sherwood Rise, PA-C on 07/11/2024 Histopathologic type: Adenocarcinoma, NOS Stage prefix: Initial diagnosis Prostate specific antigen (PSA) range: Less than 10 Gleason primary pattern: 3 Gleason secondary pattern: 4 Gleason score: 7 Histologic grading system: 5 grade system Number of biopsy cores examined: 15 Number of biopsy cores positive: 8 Location of positive needle core biopsies: Both sides   07/11/2024 Initial Diagnosis   Malignant neoplasm of prostate (HCC)       ICD-10-CM   1. Malignant neoplasm of prostate (HCC)  C61       COMPLEX SIMULATION:  The patient presented today for evaluation for possible prostate seed implant. He was brought to the radiation planning suite and placed supine on the CT couch. A 3-dimensional image study set was obtained in upload to the planning computer. There, on each axial slice, I contoured the prostate gland. Then, using three-dimensional radiation planning tools I reconstructed the prostate in view of the structures from the transperineal needle pathway to assess for possible pubic arch interference. In doing so, I did not appreciate any pubic arch interference. Also, the patient's prostate volume was estimated based on the drawn structure. The volume was 25 cc.  Given the pubic arch appearance and prostate volume,  patient remains a good candidate to proceed with prostate seed implant. Today, he freely provided informed written consent to proceed.    PLAN: The patient will undergo prostate seed implant to 145 Gy.   ________________________________  Donnice LABOR. [name], M.D.

## 2024-08-14 NOTE — Telephone Encounter (Signed)
 CALLED PATIENT TO REMIND OF PRE-SEED APPTS. FOR 08-15-24, SPOKE WITH PATIENT AND HE IS AWARE OF THESE APPTS.

## 2024-08-14 NOTE — Progress Notes (Signed)
 Radiation Oncology         [phone] ________________________________  Outpatient Follow up- Pre-seed visit  Name: Jose Villa MRN: [id number]  Date: 08/15/2024  DOB: Oct 20, 1951  RR:Ylzwpwx, Leita, FNP  [name], MD   REFERRING PHYSICIAN: [name], MD  DIAGNOSIS: 73 y.o. gentleman with Stage T1c adenocarcinoma of the prostate with Gleason score of 3+4, and PSA of 5.4.     ICD-10-CM   1. Malignant neoplasm of prostate (HCC)  C61       HISTORY OF PRESENT ILLNESS: Jose Villa is a 73 y.o. male with a diagnosis of prostate cancer. He was noted to have an elevated PSA of 4.3 by his primary care physician, Dr. Melvenia.  Accordingly, he was referred for evaluation in urology by Dr. Watt on 08/18/22,  digital rectal examination performed at that time showed no nodules within limitation of prostate being high-riding. A repeat PSA obtained that day showed a drop to 3.9, and they opted for surveillance. Unfortunately, his PSA rose again to 4.8 in 05/2023 and 5.4 in 02/2024. He was referred back to Dr. Watt on 03/21/24, and a repeat DRE at that time was stable. He underwent a prostate MRI on 05/13/24 showing: very small PI-RADS 4 lesion of right anterior peripheral zone and right anterior fibromuscular stroma at tip of apex. The patient proceeded to MRI fusion biopsy of the prostate on 06/24/24.  The prostate volume measured 26 cc.  Out of 15 core biopsies, 8 were positive.  The maximum Gleason score was 3+4, and this was seen in right mid, right base, left apex, and left mid. Additionally, Gleason 3+3 was seen in right mid lateral and all three samples from the MRI ROI (with perineural invasion).   The patient reviewed the biopsy results with his urologist and was kindly referred to us  for discussion of potential radiation treatment options. We initially met the patient on 07/01/24 and he was most interested in proceeding with brachytherapy and SpaceOAR gel placement for treatment of his disease.  He is here today for his pre-procedure imaging for planning and to answer any additional questions he may have about this treatment.   PREVIOUS RADIATION THERAPY: No  PAST MEDICAL HISTORY:  Past Medical History:  Diagnosis Date   Closed fracture of lateral malleolus 12/02/2010   Qualifier: Diagnosis of  By: Margrette MD, Taft     COPD (chronic obstructive pulmonary disease) (HCC)    Elevated PSA    Hearing loss, central       PAST SURGICAL HISTORY: Past Surgical History:  Procedure Laterality Date   FRACTURE SURGERY     right ankle   PROSTATE BIOPSY      FAMILY HISTORY:  Family History  Problem Relation Age of Onset   Cancer Mother    Asthma Sister    Breast cancer Sister     SOCIAL HISTORY:  Social History   Socioeconomic History   Marital status: Single    Spouse name: Not on file   Number of children: 1   Years of education: Not on file   Highest education level: Associate degree: occupational, Scientist, product/process development, or vocational program  Occupational History   Occupation: retired  Tobacco Use   Smoking status: Former    Current packs/day: 0.00    Average packs/day: 1.5 packs/day for 50.0 years (75.0 ttl pk-yrs)    Types: Cigarettes    Start date: 12/26/1966    Quit date: 12/26/2016    Years since quitting: 7.6   Smokeless  tobacco: Never  Vaping Use   Vaping status: Never Used  Substance and Sexual Activity   Alcohol use: Yes    Comment: 2 beers per day; one 12 pack per week   Drug use: No   Sexual activity: Not Currently  Other Topics Concern   Not on file  Social History Narrative   Lives home alone.    Social Drivers of Corporate investment banker Strain: Low Risk  (07/19/2024)   Overall Financial Resource Strain (CARDIA)    Difficulty of Paying Living Expenses: Not hard at all  Food Insecurity: No Food Insecurity (07/19/2024)   Hunger Vital Sign    Worried About Running Out of Food in the Last Year: Never true    Ran Out of Food in the Last Year: Never  true  Transportation Needs: No Transportation Needs (07/19/2024)   PRAPARE - Administrator, Civil Service (Medical): No    Lack of Transportation (Non-Medical): No  Physical Activity: Insufficiently Active (07/19/2024)   Exercise Vital Sign    Days of Exercise per Week: 3 days    Minutes of Exercise per Session: 20 min  Stress: No Stress Concern Present (07/19/2024)   [employer] of Occupational Health - Occupational Stress Questionnaire    Feeling of Stress: Not at all  Social Connections: Socially Isolated (07/19/2024)   Social Connection and Isolation Panel    Frequency of Communication with Friends and Family: Three times a week    Frequency of Social Gatherings with Friends and Family: Twice a week    Attends Religious Services: Never    Database administrator or Organizations: No    Attends Engineer, structural: Not on file    Marital Status: Divorced  Intimate Partner Violence: Not At Risk (07/01/2024)   Humiliation, Afraid, Rape, and Kick questionnaire    Fear of Current or Ex-Partner: No    Emotionally Abused: No    Physically Abused: No    Sexually Abused: No    ALLERGIES: Coconut oil fatty acid diethanolamide [cocamide dea]  MEDICATIONS:  Current Outpatient Medications  Medication Sig Dispense Refill   albuterol  (VENTOLIN  HFA) 108 (90 Base) MCG/ACT inhaler Inhale 2 puffs into the lungs every 4 (four) hours as needed for wheezing. 1 each 3   atorvastatin  (LIPITOR) 40 MG tablet Take 1 tablet (40 mg total) by mouth daily. 90 tablet 3   ezetimibe  (ZETIA ) 10 MG tablet Take 1 tablet (10 mg total) by mouth daily. 90 tablet 3   Fluticasone -Umeclidin-Vilant (TRELEGY ELLIPTA ) 100-62.5-25 MCG/ACT AEPB Inhale 1 puff into the lungs daily. 1 each 11   lisinopril -hydrochlorothiazide  (ZESTORETIC ) 20-12.5 MG tablet Take 1 tablet by mouth daily. 90 tablet 3   Omega-3 Fatty Acids (FISH OIL) 1000 MG CAPS Take 1,000 mg by mouth daily.     Vitamin D , Ergocalciferol ,  (DRISDOL ) 1.25 MG (50000 UNIT) CAPS capsule Take 1 capsule (50,000 Units total) by mouth every 7 (seven) days. 5 capsule 5   No current facility-administered medications for this visit.    REVIEW OF SYSTEMS:  On review of systems, the patient reports that he is doing well overall. He denies any chest pain, shortness of breath, cough, fevers, chills, night sweats, unintended weight changes. He denies any bowel disturbances, and denies abdominal pain, nausea or vomiting. He denies any new musculoskeletal or joint aches or pains. His IPSS was Total Score: 3, indicating mild urinary symptoms (Reference 0-7 mild, 8-19 moderate, 20-35 severe).  His SHIM: 0, indicating he  has severe erectile dysfunction (Reference - 22-25 None, 17-21 Mild, 8-16 Moderate, 1-7 Severe). A complete review of systems is obtained and is otherwise negative.     PHYSICAL EXAM:  Wt Readings from Last 3 Encounters:  07/23/24 244 lb (110.7 kg)  07/01/24 245 lb (111.1 kg)  02/28/24 248 lb (112.5 kg)   Temp Readings from Last 3 Encounters:  07/01/24 97.9 F (36.6 C)  04/21/21 98.3 F (36.8 C)  03/17/21 98.2 F (36.8 C)   BP Readings from Last 3 Encounters:  07/23/24 105/69  07/16/24 100/64  07/01/24 136/84   Pulse Readings from Last 3 Encounters:  07/23/24 91  07/16/24 72  07/01/24 91    /10  In general this is a well appearing Caucasian male in no acute distress. He's alert and oriented x4 and appropriate throughout the examination. Cardiopulmonary assessment is negative for acute distress, and he exhibits normal effort.     KPS = 100  100 - Normal; no complaints; no evidence of disease. 90   - Able to carry on normal activity; minor signs or symptoms of disease. 80   - Normal activity with effort; some signs or symptoms of disease. 55   - Cares for self; unable to carry on normal activity or to do active work. 60   - Requires occasional assistance, but is able to care for most of his personal needs. 50   -  Requires considerable assistance and frequent medical care. 40   - Disabled; requires special care and assistance. 30   - Severely disabled; hospital admission is indicated although death not imminent. 20   - Very sick; hospital admission necessary; active supportive treatment necessary. 10   - Moribund; fatal processes progressing rapidly. 0     - Dead  Karnofsky DA, Abelmann WH, Craver LS and Burchenal [facility] [phone]) The use of the nitrogen mustards in the palliative treatment of carcinoma: with particular reference to bronchogenic carcinoma Cancer 1 634-56  LABORATORY DATA:  Lab Results  Component Value Date   WBC 11.0 (H) 07/23/2024   HGB 15.7 07/23/2024   HCT 48.5 07/23/2024   MCV 98 (H) 07/23/2024   PLT 261 07/23/2024   Lab Results  Component Value Date   NA 136 07/23/2024   K 4.4 07/23/2024   CL 95 (L) 07/23/2024   CO2 24 07/23/2024   Lab Results  Component Value Date   ALT 16 07/23/2024   AST 21 07/23/2024   ALKPHOS 108 07/23/2024   BILITOT 0.5 07/23/2024     RADIOGRAPHY: No results found.    IMPRESSION/PLAN: 1. 73 y.o. gentleman with Stage T1c adenocarcinoma of the prostate with Gleason score of 3+4, and PSA of 5.4.   The patient has elected to proceed with seed implant for treatment of his disease. We reviewed the risks, benefits, short and long-term effects associated with brachytherapy and discussed the role of SpaceOAR in reducing the rectal toxicity associated with radiotherapy.  He appears to have a good understanding of his disease and our treatment recommendations which are of curative intent.  He was encouraged to ask questions that were answered to his stated satisfaction. He has freely signed written consent to proceed today in the office and a copy of this document will be placed in his medical record. His procedure is tentatively scheduled for 09/10/24 in collaboration with Dr. Watt and we will see him back for his post-procedure visit approximately 3 weeks  thereafter. We look forward to continuing to participate in his care.  He knows that he is welcome to call with any questions or concerns at any time in the interim.  I personally spent 30 minutes in this encounter including chart review, reviewing radiological studies, meeting face-to-face with the patient, entering orders and completing documentation.    [name], MMS, PA-C [facility] Radiation Oncology Physician Assistant Direct Dial: [phone]  Fax: [phone]

## 2024-08-15 ENCOUNTER — Encounter (HOSPITAL_COMMUNITY): Payer: Self-pay

## 2024-08-15 ENCOUNTER — Ambulatory Visit
Admission: RE | Admit: 2024-08-15 | Discharge: 2024-08-15 | Disposition: A | Discharge status: Home / Self Care | Source: Ambulatory Visit | Attending: Radiation Oncology | Admitting: Radiation Oncology

## 2024-08-15 ENCOUNTER — Encounter (HOSPITAL_COMMUNITY)
Admission: RE | Admit: 2024-08-15 | Discharge: 2024-08-15 | Disposition: A | Discharge status: Home / Self Care | Source: Ambulatory Visit | Attending: Urology | Admitting: Urology

## 2024-08-15 ENCOUNTER — Ambulatory Visit
Admission: RE | Admit: 2024-08-15 | Discharge: 2024-08-15 | Disposition: A | Discharge status: Home / Self Care | Payer: Self-pay | Source: Ambulatory Visit | Attending: Urology | Admitting: Urology

## 2024-08-15 ENCOUNTER — Encounter: Payer: Self-pay | Admitting: Urology

## 2024-08-15 ENCOUNTER — Other Ambulatory Visit: Payer: Self-pay

## 2024-08-15 VITALS — Wt 254.6 lb

## 2024-08-15 VITALS — BP 123/73 | HR 82 | Temp 97.5°F | Resp 18 | Ht 71.0 in | Wt 244.0 lb

## 2024-08-15 DIAGNOSIS — Z510 Encounter for antineoplastic radiation therapy: Secondary | ICD-10-CM | POA: Insufficient documentation

## 2024-08-15 DIAGNOSIS — I444 Left anterior fascicular block: Secondary | ICD-10-CM | POA: Insufficient documentation

## 2024-08-15 DIAGNOSIS — E118 Type 2 diabetes mellitus with unspecified complications: Secondary | ICD-10-CM | POA: Insufficient documentation

## 2024-08-15 DIAGNOSIS — I493 Ventricular premature depolarization: Secondary | ICD-10-CM | POA: Insufficient documentation

## 2024-08-15 DIAGNOSIS — Z191 Hormone sensitive malignancy status: Secondary | ICD-10-CM | POA: Diagnosis not present

## 2024-08-15 DIAGNOSIS — I10 Essential (primary) hypertension: Secondary | ICD-10-CM | POA: Insufficient documentation

## 2024-08-15 DIAGNOSIS — C61 Malignant neoplasm of prostate: Secondary | ICD-10-CM | POA: Diagnosis not present

## 2024-08-15 DIAGNOSIS — Z01818 Encounter for other preprocedural examination: Secondary | ICD-10-CM | POA: Insufficient documentation

## 2024-08-15 HISTORY — DX: Malignant (primary) neoplasm, unspecified: C80.1

## 2024-08-15 LAB — CBC
HCT: 45 % (ref 39.0–52.0)
Hemoglobin: 14.4 g/dL (ref 13.0–17.0)
MCH: 31 pg (ref 26.0–34.0)
MCHC: 32 g/dL (ref 30.0–36.0)
MCV: 97 fL (ref 80.0–100.0)
Platelets: 249 K/uL (ref 150–400)
RBC: 4.64 MIL/uL (ref 4.22–5.81)
RDW: 13.1 % (ref 11.5–15.5)
WBC: 9 K/uL (ref 4.0–10.5)
nRBC: 0 % (ref 0.0–0.2)

## 2024-08-15 LAB — BASIC METABOLIC PANEL WITH GFR
Anion gap: 9 (ref 5–15)
BUN: 19 mg/dL (ref 8–23)
CO2: 28 mmol/L (ref 22–32)
Calcium: 10.3 mg/dL (ref 8.9–10.3)
Chloride: 99 mmol/L (ref 98–111)
Creatinine, Ser: 1.05 mg/dL (ref 0.61–1.24)
GFR, Estimated: >60 mL/min (ref >60)
Glucose, Bld: 123 mg/dL — ABNORMAL HIGH (ref 70–99)
Potassium: 3.9 mmol/L (ref 3.5–5.1)
Sodium: 136 mmol/L (ref 135–145)

## 2024-08-15 LAB — GLUCOSE, CAPILLARY: Glucose-Capillary: 159 mg/dL — ABNORMAL HIGH (ref 70–99)

## 2024-08-17 NOTE — Progress Notes (Signed)
 Impression/Assessment:  Grade group 2 prostate cancer, scheduled for I-125 brachytherapy/SpaceOAR in September  Plan:  The procedure was again discussed with him  I will see him back a few weeks after his brachytherapy.  History of Present Illness: 73 year old male, previously been followed by Dr. Norleen Seltzer.  He is here today to discuss his new diagnosis of prostate cancer.  He underwent fusion biopsy on June 14, 2024.  At that time, PSA was 5.4.  He had a PI-RADS 4 lesion in the right prostate.  Prostatic volume was 31 mL on that MRI.  On fusion biopsy, all 3 cores from the region of interest revealed GS 3+3 pattern.  60, 50 and 40% of cores were involved with cancer with perineural invasion seen.  On the systematic biopsy, 5/12 cores revealed adenocarcinoma.  4 cores (right base medial, right mid medial, left mid medial, left apex medial) revealed GS 3+4 pattern 30, 60, 10 and 40% of cores respectively. 1 core (right mid lateral) revealed GS 3+3 pattern and 20% of core.  He is scheduled for I-125 brachytherapy with SpaceOAR placement on September 16.  Past Medical History:  Diagnosis Date   Cancer [facility])    Closed fracture of lateral malleolus 12/02/2010   Qualifier: Diagnosis of  By: Margrette MD, Taft     COPD (chronic obstructive pulmonary disease) (HCC)    Elevated PSA    Hearing loss, central     Past Surgical History:  Procedure Laterality Date   FRACTURE SURGERY     right ankle   PROSTATE BIOPSY      Home Medications:  Allergies as of 08/20/2024       Reactions   Coconut Oil Fatty Acid Diethanolamide [cocamide Dea] Hives        Medication List        Accurate as of August 17, 2024  9:21 AM. If you have any questions, ask your nurse or doctor.          albuterol  108 (90 Base) MCG/ACT inhaler Commonly known as: Ventolin  HFA Inhale 2 puffs into the lungs every 4 (four) hours as needed for wheezing.   atorvastatin  40 MG tablet Commonly known as:  LIPITOR Take 1 tablet (40 mg total) by mouth daily.   ezetimibe  10 MG tablet Commonly known as: ZETIA  Take 1 tablet (10 mg total) by mouth daily.   Fish Oil 1000 MG Caps Take 1,000 mg by mouth daily.   lisinopril -hydrochlorothiazide  20-12.5 MG tablet Commonly known as: Zestoretic  Take 1 tablet by mouth daily.   Trelegy Ellipta  100-62.5-25 MCG/ACT Aepb Generic drug: Fluticasone -Umeclidin-Vilant Inhale 1 puff into the lungs daily.   Vitamin D  (Ergocalciferol ) 1.25 MG (50000 UNIT) Caps capsule Commonly known as: DRISDOL  Take 1 capsule (50,000 Units total) by mouth every 7 (seven) days.        Allergies:  Allergies  Allergen Reactions   Coconut Oil Fatty Acid Diethanolamide [Cocamide Dea] Hives    Family History  Problem Relation Age of Onset   Cancer Mother    Asthma Sister    Breast cancer Sister     Social History:  reports that he quit smoking about 7 years ago. His smoking use included cigarettes. He started smoking about 57 years ago. He has a 75 pack-year smoking history. He has never used smokeless tobacco. He reports current alcohol use. He reports that he does not use drugs.  ROS: A complete review of systems was performed.  All systems are negative except for pertinent findings as noted.  Physical Exam:  Vital signs in last 24 hours: There were no vitals taken for this visit. Constitutional:  Alert and oriented, No acute distress Cardiovascular: Regular rate  Respiratory: Normal respiratory effort Lymphatic: No lymphadenopathy Neurologic: Grossly intact, no focal deficits Psychiatric: Normal mood and affect  I have reviewed prior pt notes from Dr. Watt  I have reviewed notes from radiation oncology  I have reviewed urinalysis results  I have independently reviewed prior imaging--prostate ultrasound, MRI  I have reviewed prior PSA and pathology results

## 2024-08-20 ENCOUNTER — Ambulatory Visit (INDEPENDENT_AMBULATORY_CARE_PROVIDER_SITE_OTHER): Admitting: Urology

## 2024-08-20 VITALS — BP 115/69 | HR 91

## 2024-08-20 DIAGNOSIS — C61 Malignant neoplasm of prostate: Secondary | ICD-10-CM

## 2024-08-20 LAB — URINALYSIS, ROUTINE W REFLEX MICROSCOPIC
Bilirubin, UA: NEGATIVE
Glucose, UA: NEGATIVE
Ketones, UA: NEGATIVE
Leukocytes,UA: NEGATIVE
Nitrite, UA: NEGATIVE
Protein,UA: NEGATIVE
RBC, UA: NEGATIVE
Specific Gravity, UA: 1.015 (ref 1.005–1.030)
Urobilinogen, Ur: 0.2 mg/dL (ref 0.2–1.0)
pH, UA: 6.5 (ref 5.0–7.5)

## 2024-09-06 NOTE — Anesthesia Preprocedure Evaluation (Addendum)
 Anesthesia Evaluation  Patient identified by MRN, date of birth, ID band Patient awake    Reviewed: Allergy & Precautions, NPO status , Patient's Chart, lab work & pertinent test results  Airway Mallampati: IV  TM Distance: >3 FB Neck ROM: Full    Dental  (+) Teeth Intact, Dental Advisory Given Large beard:   Pulmonary COPD (uses rescue inhaler a couple times per day, last used yesterday),  COPD inhaler, former smoker Quit smoking 2018, 75 pack year history   Snores at night, no sleep study    Pulmonary exam normal breath sounds clear to auscultation       Cardiovascular hypertension (117/70 preop), Pt. on medications Normal cardiovascular exam Rhythm:Regular Rate:Normal     Neuro/Psych negative neurological ROS  negative psych ROS   GI/Hepatic negative GI ROS, Neg liver ROS,,,  Endo/Other  negative endocrine ROS  BMI 34  Renal/GU negative Renal ROS  negative genitourinary   Musculoskeletal negative musculoskeletal ROS (+)    Abdominal  (+) + obese  Peds  Hematology negative hematology ROS (+)   Anesthesia Other Findings   Reproductive/Obstetrics negative OB ROS                              Anesthesia Physical Anesthesia Plan  ASA: 3  Anesthesia Plan: General   Post-op Pain Management: Tylenol  PO (pre-op)*   Induction: Intravenous  PONV Risk Score and Plan: 2 and Ondansetron , Dexamethasone , Midazolam  and Treatment may vary due to age or medical condition  Airway Management Planned: Oral ETT  Additional Equipment: None  Intra-op Plan:   Post-operative Plan: Extubation in OR  Informed Consent: I have reviewed the patients History and Physical, chart, labs and discussed the procedure including the risks, benefits and alternatives for the proposed anesthesia with the patient or authorized representative who has indicated his/her understanding and acceptance.     Dental  advisory given  Plan Discussed with: CRNA  Anesthesia Plan Comments: (Mall4, no prior airway notes- glide available COPD- saturating 89% on RA in preop, will give albuterol  preop)         Anesthesia Quick Evaluation

## 2024-09-09 ENCOUNTER — Telehealth: Payer: Self-pay | Admitting: *Deleted

## 2024-09-09 ENCOUNTER — Encounter (HOSPITAL_COMMUNITY): Payer: Self-pay | Admitting: Urology

## 2024-09-09 NOTE — Telephone Encounter (Signed)
 Called patient to remind of procedure for 09-10-24, spoke with patient and he is aware of this procedure

## 2024-09-10 ENCOUNTER — Encounter (HOSPITAL_COMMUNITY)
Admission: RE | Disposition: A | Discharge status: Home / Self Care | Payer: Self-pay | Source: Home / Self Care | Attending: Urology

## 2024-09-10 ENCOUNTER — Ambulatory Visit (HOSPITAL_COMMUNITY): Payer: Self-pay | Admitting: Medical

## 2024-09-10 ENCOUNTER — Ambulatory Visit (HOSPITAL_COMMUNITY)

## 2024-09-10 ENCOUNTER — Ambulatory Visit (HOSPITAL_BASED_OUTPATIENT_CLINIC_OR_DEPARTMENT_OTHER): Payer: Self-pay | Admitting: Anesthesiology

## 2024-09-10 ENCOUNTER — Ambulatory Visit (HOSPITAL_COMMUNITY)
Admission: RE | Admit: 2024-09-10 | Discharge: 2024-09-10 | Disposition: A | Discharge status: Home / Self Care | Attending: Urology | Admitting: Urology

## 2024-09-10 ENCOUNTER — Other Ambulatory Visit: Payer: Self-pay

## 2024-09-10 ENCOUNTER — Encounter (HOSPITAL_COMMUNITY): Payer: Self-pay | Admitting: Urology

## 2024-09-10 DIAGNOSIS — I10 Essential (primary) hypertension: Secondary | ICD-10-CM | POA: Diagnosis not present

## 2024-09-10 DIAGNOSIS — Z87891 Personal history of nicotine dependence: Secondary | ICD-10-CM | POA: Diagnosis not present

## 2024-09-10 DIAGNOSIS — Z79899 Other long term (current) drug therapy: Secondary | ICD-10-CM | POA: Diagnosis not present

## 2024-09-10 DIAGNOSIS — J449 Chronic obstructive pulmonary disease, unspecified: Secondary | ICD-10-CM

## 2024-09-10 DIAGNOSIS — Z191 Hormone sensitive malignancy status: Secondary | ICD-10-CM | POA: Diagnosis not present

## 2024-09-10 DIAGNOSIS — C61 Malignant neoplasm of prostate: Secondary | ICD-10-CM

## 2024-09-10 HISTORY — PX: SPACE OAR INSTILLATION: SHX6769

## 2024-09-10 HISTORY — PX: RADIOACTIVE SEED IMPLANT: SHX5150

## 2024-09-10 SURGERY — INSERTION, RADIATION SOURCE, PROSTATE
Anesthesia: General

## 2024-09-10 MED ORDER — ACETAMINOPHEN 650 MG RE SUPP: 650.0000 mg | RECTAL | Status: DC | PRN

## 2024-09-10 MED ORDER — FLEET ENEMA RE ENEM
1.0000 | ENEMA | Freq: Once | RECTAL | Status: DC
Start: 2024-09-10 — End: 2024-09-10

## 2024-09-10 MED ORDER — HYDROMORPHONE HCL 1 MG/ML IJ SOLN: 0.2500 mg | INTRAMUSCULAR | Status: DC | PRN

## 2024-09-10 MED ORDER — ACETAMINOPHEN 325 MG PO TABS: 650.0000 mg | ORAL_TABLET | ORAL | Status: DC | PRN

## 2024-09-10 MED ORDER — FENTANYL CITRATE (PF) 100 MCG/2ML IJ SOLN
INTRAMUSCULAR | Status: AC
  Filled 2024-09-10: qty 2

## 2024-09-10 MED ORDER — SODIUM CHLORIDE 0.9 % IV SOLN: 250.0000 mL | INTRAVENOUS | Status: DC | PRN

## 2024-09-10 MED ORDER — SODIUM CHLORIDE 0.9% FLUSH: 3.0000 mL | Freq: Two times a day (BID) | INTRAVENOUS | Status: DC

## 2024-09-10 MED ORDER — CIPROFLOXACIN IN D5W 400 MG/200ML IV SOLN
400.0000 mg | INTRAVENOUS | Status: AC
Start: 2024-09-10 — End: 2024-09-10
  Administered 2024-09-10: 400 mg via INTRAVENOUS
  Filled 2024-09-10: qty 200

## 2024-09-10 MED ORDER — SUGAMMADEX SODIUM 200 MG/2ML IV SOLN
INTRAVENOUS | Status: AC
  Filled 2024-09-10: qty 2

## 2024-09-10 MED ORDER — MORPHINE SULFATE (PF) 2 MG/ML IV SOLN: 2.0000 mg | INTRAVENOUS | Status: DC | PRN

## 2024-09-10 MED ORDER — PROPOFOL 10 MG/ML IV BOLUS
INTRAVENOUS | Status: AC
  Filled 2024-09-10: qty 20

## 2024-09-10 MED ORDER — OXYCODONE HCL 5 MG PO TABS: 5.0000 mg | ORAL_TABLET | ORAL | Status: DC | PRN

## 2024-09-10 MED ORDER — OXYCODONE HCL 5 MG PO TABS: 5.0000 mg | ORAL_TABLET | Freq: Once | ORAL | Status: DC | PRN

## 2024-09-10 MED ORDER — SUCCINYLCHOLINE CHLORIDE 200 MG/10ML IV SOSY
PREFILLED_SYRINGE | INTRAVENOUS | Status: DC | PRN
  Administered 2024-09-10: 150 mg via INTRAVENOUS

## 2024-09-10 MED ORDER — EPHEDRINE 5 MG/ML INJ
INTRAVENOUS | Status: AC
  Filled 2024-09-10: qty 5

## 2024-09-10 MED ORDER — DEXAMETHASONE SODIUM PHOSPHATE 10 MG/ML IJ SOLN
INTRAMUSCULAR | Status: DC | PRN
  Administered 2024-09-10: 10 mg via INTRAVENOUS

## 2024-09-10 MED ORDER — ROCURONIUM BROMIDE 10 MG/ML (PF) SYRINGE
PREFILLED_SYRINGE | INTRAVENOUS | Status: AC
  Filled 2024-09-10: qty 10

## 2024-09-10 MED ORDER — SODIUM CHLORIDE 0.9% FLUSH: 3.0000 mL | INTRAVENOUS | Status: DC | PRN

## 2024-09-10 MED ORDER — SUGAMMADEX SODIUM 200 MG/2ML IV SOLN
INTRAVENOUS | Status: DC | PRN
  Administered 2024-09-10: 200 mg via INTRAVENOUS

## 2024-09-10 MED ORDER — ONDANSETRON HCL 4 MG/2ML IJ SOLN
INTRAMUSCULAR | Status: DC | PRN
Start: 2024-09-10 — End: 2024-09-10
  Administered 2024-09-10: 4 mg via INTRAVENOUS

## 2024-09-10 MED ORDER — SODIUM CHLORIDE FLUSH 0.9 % IV SOLN
INTRAVENOUS | Status: DC | PRN
  Administered 2024-09-10: 10 mL

## 2024-09-10 MED ORDER — SODIUM CHLORIDE (PF) 0.9 % IJ SOLN
INTRAMUSCULAR | Status: AC
  Filled 2024-09-10: qty 10

## 2024-09-10 MED ORDER — IPRATROPIUM-ALBUTEROL 0.5-2.5 (3) MG/3ML IN SOLN
RESPIRATORY_TRACT | Status: AC
  Filled 2024-09-10: qty 3

## 2024-09-10 MED ORDER — PHENYLEPHRINE 80 MCG/ML (10ML) SYRINGE FOR IV PUSH (FOR BLOOD PRESSURE SUPPORT)
PREFILLED_SYRINGE | INTRAVENOUS | Status: AC
  Filled 2024-09-10: qty 10

## 2024-09-10 MED ORDER — IPRATROPIUM-ALBUTEROL 0.5-2.5 (3) MG/3ML IN SOLN
3.0000 mL | Freq: Once | RESPIRATORY_TRACT | Status: AC
  Administered 2024-09-10: 3 mL via RESPIRATORY_TRACT

## 2024-09-10 MED ORDER — ROCURONIUM BROMIDE 100 MG/10ML IV SOLN
INTRAVENOUS | Status: DC | PRN
  Administered 2024-09-10: 50 mg via INTRAVENOUS

## 2024-09-10 MED ORDER — ORAL CARE MOUTH RINSE
15.0000 mL | Freq: Once | OROMUCOSAL | Status: DC
Start: 2024-09-10 — End: 2024-09-10

## 2024-09-10 MED ORDER — FENTANYL CITRATE (PF) 100 MCG/2ML IJ SOLN
INTRAMUSCULAR | Status: DC | PRN
  Administered 2024-09-10: 50 ug via INTRAVENOUS

## 2024-09-10 MED ORDER — CHLORHEXIDINE GLUCONATE 0.12 % MT SOLN
15.0000 mL | Freq: Once | OROMUCOSAL | Status: DC
Start: 2024-09-10 — End: 2024-09-10

## 2024-09-10 MED ORDER — PHENYLEPHRINE HCL-NACL 20-0.9 MG/250ML-% IV SOLN
INTRAVENOUS | Status: AC
  Filled 2024-09-10: qty 250

## 2024-09-10 MED ORDER — MIDAZOLAM HCL 2 MG/2ML IJ SOLN
INTRAMUSCULAR | Status: AC
  Filled 2024-09-10: qty 2

## 2024-09-10 MED ORDER — LIDOCAINE HCL (PF) 2 % IJ SOLN
INTRAMUSCULAR | Status: AC
  Filled 2024-09-10: qty 5

## 2024-09-10 MED ORDER — FLEET ENEMA RE ENEM: 1.0000 | ENEMA | Freq: Once | RECTAL | Status: DC

## 2024-09-10 MED ORDER — ONDANSETRON HCL 4 MG/2ML IJ SOLN: 4.0000 mg | Freq: Once | INTRAMUSCULAR | Status: DC | PRN

## 2024-09-10 MED ORDER — LIDOCAINE HCL (CARDIAC) PF 100 MG/5ML IV SOSY
PREFILLED_SYRINGE | INTRAVENOUS | Status: DC | PRN
  Administered 2024-09-10: 60 mg via INTRAVENOUS

## 2024-09-10 MED ORDER — AMISULPRIDE (ANTIEMETIC) 5 MG/2ML IV SOLN: 10.0000 mg | Freq: Once | INTRAVENOUS | Status: DC | PRN

## 2024-09-10 MED ORDER — LACTATED RINGERS IV SOLN: INTRAVENOUS | Status: DC

## 2024-09-10 MED ORDER — 0.9 % SODIUM CHLORIDE (POUR BTL) OPTIME
TOPICAL | Status: DC | PRN
  Administered 2024-09-10: 1000 mL

## 2024-09-10 MED ORDER — IOHEXOL 300 MG/ML  SOLN
INTRAMUSCULAR | Status: DC | PRN
Start: 2024-09-10 — End: 2024-09-10
  Administered 2024-09-10: 10 mL

## 2024-09-10 MED ORDER — HYDROCODONE-ACETAMINOPHEN 5-325 MG PO TABS: 1.0000 | ORAL_TABLET | Freq: Four times a day (QID) | ORAL | 0 refills | Status: AC | PRN

## 2024-09-10 MED ORDER — OXYCODONE HCL 5 MG/5ML PO SOLN: 5.0000 mg | Freq: Once | ORAL | Status: DC | PRN

## 2024-09-10 MED ORDER — ONDANSETRON HCL 4 MG/2ML IJ SOLN
INTRAMUSCULAR | Status: AC
  Filled 2024-09-10: qty 2

## 2024-09-10 MED ORDER — PROPOFOL 10 MG/ML IV BOLUS
INTRAVENOUS | Status: DC | PRN
  Administered 2024-09-10: 150 mg via INTRAVENOUS

## 2024-09-10 MED ORDER — ACETAMINOPHEN 500 MG PO TABS
1000.0000 mg | ORAL_TABLET | Freq: Once | ORAL | Status: AC
  Administered 2024-09-10: 1000 mg via ORAL
  Filled 2024-09-10: qty 2

## 2024-09-10 MED ORDER — DEXAMETHASONE SODIUM PHOSPHATE 10 MG/ML IJ SOLN
INTRAMUSCULAR | Status: AC
  Filled 2024-09-10: qty 1

## 2024-09-10 SURGICAL SUPPLY — 28 items
BAG URINE DRAIN 2000ML AR STRL (UROLOGICAL SUPPLIES) ×1 IMPLANT
BLADE CLIPPER SURG (BLADE) ×1 IMPLANT
Bard Quicklink Cartridges with BrachySource IMPLANT
CATH ROBINSON RED A/P 20FR (CATHETERS) ×1 IMPLANT
COVER BACK TABLE 60X90IN (DRAPES) ×1 IMPLANT
COVER MAYO STAND STRL (DRAPES) ×1 IMPLANT
DRAPE SURG IRRIG POUCH 19X23 (DRAPES) ×1 IMPLANT
DRSG TEGADERM 8X12 (GAUZE/BANDAGES/DRESSINGS) ×1 IMPLANT
GLOVE SURG SS PI 8.0 STRL IVOR (GLOVE) ×1 IMPLANT
GOWN STRL REUS W/ TWL XL LVL3 (GOWN DISPOSABLE) ×1 IMPLANT
GOWN STRL SURGICAL XL XLNG (GOWN DISPOSABLE) ×1 IMPLANT
GRID BRACH TEMP 18GA 2.8X3X.75 (MISCELLANEOUS) ×1 IMPLANT
HOLDER FOLEY CATH W/STRAP (MISCELLANEOUS) ×1 IMPLANT
IMPL SPACEOAR SYSTEM 10ML (Spacer) ×1 IMPLANT
KIT TURNOVER KIT A (KITS) ×1 IMPLANT
MARKER SKIN DUAL TIP RULER LAB (MISCELLANEOUS) ×1 IMPLANT
NDL BRACHY 18G 5PK (NEEDLE) ×4 IMPLANT
NDL BRACHY 18G SINGLE (NEEDLE) IMPLANT
NDL PK MORGANSTERN STABILIZ (NEEDLE) ×1 IMPLANT
NEEDLE BRACHY 18G 5PK (NEEDLE) ×4 IMPLANT
NEEDLE BRACHY 18G SINGLE (NEEDLE) IMPLANT
NEEDLE PK MORGANSTERN STABILIZ (NEEDLE) ×1 IMPLANT
PACK CYSTO (CUSTOM PROCEDURE TRAY) ×1 IMPLANT
PREP POVIDONE IODINE SPRAY 2OZ (MISCELLANEOUS) ×1 IMPLANT
SYR 10ML LL (SYRINGE) IMPLANT
TOWEL OR 17X26 10 PK STRL BLUE (TOWEL DISPOSABLE) ×1 IMPLANT
TRAY FOLEY MTR SLVR 16FR STAT (SET/KITS/TRAYS/PACK) ×1 IMPLANT
UNDERPAD 30X36 HEAVY ABSORB (UNDERPADS AND DIAPERS) ×2 IMPLANT

## 2024-09-10 NOTE — Transfer of Care (Signed)
 Immediate Anesthesia Transfer of Care Note  Patient: Jose Villa  Procedure(s) Performed: INSERTION, RADIATION SOURCE, PROSTATE INJECTION, HYDROGEL SPACER  Patient Location: PACU  Anesthesia Type:General  Level of Consciousness: awake, alert , oriented, and patient cooperative  Airway & Oxygen  Therapy: Patient Spontanous Breathing and Patient connected to face mask oxygen   Post-op Assessment: Report given to RN and Post -op Vital signs reviewed and stable  Post vital signs: Reviewed and stable  Last Vitals:  Vitals Value Taken Time  BP 113/78   Temp    Pulse 83 09/10/24 09:10  Resp 18 09/10/24 09:10  SpO2 92 % 09/10/24 09:10  Vitals shown include unfiled device data.  Last Pain:  Vitals:   09/10/24 0604  TempSrc: Oral  PainSc: 0-No pain         Complications: No notable events documented.

## 2024-09-10 NOTE — Anesthesia Postprocedure Evaluation (Signed)
 Anesthesia Post Note  Patient: Jose Villa  Procedure(s) Performed: INSERTION, RADIATION SOURCE, PROSTATE INJECTION, HYDROGEL SPACER     Patient location during evaluation: PACU Anesthesia Type: General Level of consciousness: awake and alert, oriented and patient cooperative Pain management: pain level controlled Vital Signs Assessment: post-procedure vital signs reviewed and stable Respiratory status: spontaneous breathing, nonlabored ventilation and respiratory function stable Cardiovascular status: blood pressure returned to baseline and stable Postop Assessment: no apparent nausea or vomiting Anesthetic complications: no Comments: Severe COPD- baseline O2 sats mid-high 80s   No notable events documented.  Last Vitals:  Vitals:   09/10/24 0906 09/10/24 0930  BP:  120/61  Pulse: 84 72  Resp: 14 (!) 23  Temp: 36.5 C   SpO2: 91% 96%    Last Pain:  Vitals:   09/10/24 0930  TempSrc:   PainSc: 0-No pain                 Almarie CHRISTELLA Marchi

## 2024-09-10 NOTE — Anesthesia Procedure Notes (Signed)
 Procedure Name: Intubation Date/Time: 09/10/2024 7:51 AM  Performed by: Nada Corean CROME, CRNAPre-anesthesia Checklist: Suction available, Emergency Drugs available, Patient identified, Patient being monitored and Timeout performed Patient Re-evaluated:Patient Re-evaluated prior to induction Oxygen  Delivery Method: Circle system utilized Preoxygenation: Pre-oxygenation with 100% oxygen  Induction Type: IV induction and Cricoid Pressure applied Ventilation: Mask ventilation without difficulty Laryngoscope Size: 4 and Glidescope Grade View: Grade I Tube type: Oral Tube size: 7.5 mm Number of attempts: 2 Airway Equipment and Method: Stylet and Video-laryngoscopy Placement Confirmation: ETT inserted through vocal cords under direct vision, positive ETCO2 and breath sounds checked- equal and bilateral Secured at: 23 cm Tube secured with: Tape Dental Injury: Teeth and Oropharynx as per pre-operative assessment

## 2024-09-10 NOTE — Op Note (Signed)
 PATIENT:  Jose Villa  PRE-OPERATIVE DIAGNOSIS:  Adenocarcinoma of the prostate  POST-OPERATIVE DIAGNOSIS:  Same  PROCEDURE:  Procedure(s): 1. I-125 radioactive seed implantation 2. SpaceOAR implantation. 3.  Cystoscopy  SURGEON:  Surgeon(s): Norleen Seltzer MD  Radiation oncologist: Dr. Donnice Barge  ANESTHESIA:  General  EBL:  Minimal  DRAINS: 16 French Foley catheter  INDICATION: Jose Villa is a 73 y.o. with Stage T1c, Gleason 7(3+4) prostate cancer who has elected brachytherapy for treatment.  Description of procedure: After informed consent the patient was brought to the major OR, placed on the table and administered general anesthesia. He was then moved to the modified lithotomy position with his perineum perpendicular to the floor. His perineum and genitalia were then sterilely prepped. An official timeout was then performed. A 16 French Foley catheter was then placed in the bladder and filled with dilute contrast, a rectal tube was placed in the rectum and the transrectal ultrasound probe was placed in the rectum and affixed to the stand. He was then sterilely draped.  The sterile grid was installed.   Anchor needles were then placed.   Real time ultrasonography was used along with the seed planning software spot-pro version 3.1-00. This was used to develop the seed plan including the number of needles as well as number of seeds required for complete and adequate coverage. Real-time ultrasonography was then used along with the previously developed plan  to implant a total of 60 seeds using 16 needles for a target dose of 145 Gy. This proceeded without difficulty or complication.  The anchor needles and guide were removed and the SpaceOAR needle was passed under US  guidance into the fat stripe posterior to the prostate with the tip in the midline at mid prostate. A puff of NS confirmed appropriate positioning and the SpaceOAR polymer was then injected over 10 seconds into the  space with excellent distribution.     A Foley catheter was then removed as well as the transrectal ultrasound probe and rectal probe. Flexible cystoscopy was then performed using the 17 French flexible scope which revealed a normal urethra throughout its length down to the sphincter which appeared intact. The prostatic urethra was 2cm with bilobar hyperplasia. The bladder was then entered and fully and systematically inspected.  The ureteral orifices were noted to be of normal configuration and position. The mucosa revealed no evidence of tumors. There were also no stones identified within the bladder.  No seeds or spacers were seen and/or removed from the bladder.  The cystoscope was then removed.  The drapes were removed.  The perineum was cleaned and dressed.  He was taken out of the lithotomy position and was awakened and taken to recovery room in stable and satisfactory condition. He tolerated procedure well and there were no intraoperative complications.

## 2024-09-10 NOTE — H&P (Signed)
 Impression/Assessment:  Grade group 2 prostate cancer, scheduled for I-125 brachytherapy/SpaceOAR in September  Plan:  The procedure was again discussed with him  I will see him back a few weeks after his brachytherapy.  History of Present Illness: 73 year old male, previously been followed by Dr. Norleen Seltzer.  He is here today to discuss his new diagnosis of prostate cancer.  He underwent fusion biopsy on June 14, 2024.  At that time, PSA was 5.4.  He had a PI-RADS 4 lesion in the right prostate.  Prostatic volume was 31 mL on that MRI.  On fusion biopsy, all 3 cores from the region of interest revealed GS 3+3 pattern.  60, 50 and 40% of cores were involved with cancer with perineural invasion seen.  On the systematic biopsy, 5/12 cores revealed adenocarcinoma.  4 cores (right base medial, right mid medial, left mid medial, left apex medial) revealed GS 3+4 pattern 30, 60, 10 and 40% of cores respectively. 1 core (right mid lateral) revealed GS 3+3 pattern and 20% of core.  He is scheduled for I-125 brachytherapy with SpaceOAR placement on September 16.  Past Medical History:  Diagnosis Date   Cancer [facility])    Closed fracture of lateral malleolus 12/02/2010   Qualifier: Diagnosis of  By: Margrette MD, Taft     COPD (chronic obstructive pulmonary disease) (HCC)    Elevated PSA    Hearing loss, central     Past Surgical History:  Procedure Laterality Date   FRACTURE SURGERY     right ankle   PROSTATE BIOPSY      Home Medications:  Atorvastatin , zetia , lisinopril  hydrochlorothiazide ,  fish oil, vit D, albuterol , trelegy ellipta    Allergies:  Allergies  Allergen Reactions   Coconut Oil Fatty Acid Diethanolamide [Cocamide Dea] Hives    Family History  Problem Relation Age of Onset   Cancer Mother    Asthma Sister    Breast cancer Sister     Social History:  reports that he quit smoking about 7 years ago. His smoking use included cigarettes. He started smoking about 57  years ago. He has a 75 pack-year smoking history. He has never used smokeless tobacco. He reports current alcohol use. He reports that he does not use drugs.  ROS: A complete review of systems was performed.  All systems are negative except for pertinent findings as noted.  Physical Exam:  Vital signs in last 24 hours: BP 117/70   Pulse 64   Temp 98 F (36.7 C) (Oral)   Resp 18   Ht 5' 11 (1.803 m)   Wt 108.9 kg   SpO2 92%   BMI 33.47 kg/m  Constitutional:  Alert and oriented, No acute distress Cardiovascular: Regular rate  Respiratory: Normal respiratory effort Lymphatic: No lymphadenopathy Neurologic: Grossly intact, no focal deficits Psychiatric: Normal mood and affect  I have reviewed prior pt notes from Dr. Seltzer  I have reviewed notes from radiation oncology  I have reviewed urinalysis results  I have independently reviewed prior imaging--prostate ultrasound, MRI  I have reviewed prior PSA and pathology results

## 2024-09-10 NOTE — Interval H&P Note (Signed)
 History and Physical Interval Note:  No change.  09/10/2024 7:36 AM  Jose Villa  has presented today for surgery, with the diagnosis of PROSTATE CANCER.  The various methods of treatment have been discussed with the patient and family. After consideration of risks, benefits and other options for treatment, the patient has consented to  Procedure(s): INSERTION, RADIATION SOURCE, PROSTATE (N/A) INJECTION, HYDROGEL SPACER (N/A) as a surgical intervention.  The patient's history has been reviewed, patient examined, no change in status, stable for surgery.  I have reviewed the patient's chart and labs.  Questions were answered to the patient's satisfaction.     Genie Wenke

## 2024-09-10 NOTE — Progress Notes (Signed)
  Radiation Oncology         [phone] ________________________________  Name: Jose Villa MRN: [id number]  Date: 09/10/2024  DOB: 16-Jan-1951       Prostate Seed Implant  RR:Ylzwpwx, Leita, FNP  No ref. provider found  DIAGNOSIS:  73 y.o. gentleman with Stage T1c adenocarcinoma of the prostate with Gleason score of 3+4, and PSA of 5.4.   Oncology History  Malignant neoplasm of prostate (HCC)  06/24/2024 Cancer Staging   Staging form: Prostate, AJCC 8th Edition - Clinical stage from 06/24/2024: Stage IIB (cT1c, cN0, cM0, PSA: 5.4, Grade Group: 2) - Signed by Sherwood Rise, PA-C on 07/11/2024 Histopathologic type: Adenocarcinoma, NOS Stage prefix: Initial diagnosis Prostate specific antigen (PSA) range: Less than 10 Gleason primary pattern: 3 Gleason secondary pattern: 4 Gleason score: 7 Histologic grading system: 5 grade system Number of biopsy cores examined: 15 Number of biopsy cores positive: 8 Location of positive needle core biopsies: Both sides   07/11/2024 Initial Diagnosis   Malignant neoplasm of prostate (HCC)     No diagnosis found.  PROCEDURE: Insertion of radioactive I-125 seeds into the prostate gland.  RADIATION DOSE: 145 Gy, definitive therapy.  TECHNIQUE: Rally Ouch Burks was brought to the operating room with the urologist. He was placed in the dorsolithotomy position. He was catheterized and a rectal tube was inserted. The perineum was shaved, prepped and draped. The ultrasound probe was then introduced by me into the rectum to see the prostate gland.  TREATMENT DEVICE: I attached the needle grid to the ultrasound probe stand and anchor needles were placed.  3D PLANNING: The prostate was imaged in 3D using a sagittal sweep of the prostate probe. These images were transferred to the planning computer. There, the prostate, urethra and rectum were defined on each axial reconstructed image. Then, the software created an optimized 3D plan and a few seed  positions were adjusted. The quality of the plan was reviewed using [facility] information for the target and the following two organs at risk:  Urethra and Rectum.  Then the accepted plan was printed and handed off to the radiation therapist.  Under my supervision, the custom loading of the seeds and spacers was carried out using the quick loader.  These pre-loaded needles were then placed into the needle holder.SABRA  PROSTATE VOLUME STUDY:  Using transrectal ultrasound the volume of the prostate was verified to be 26.6 cc.  SPECIAL TREATMENT PROCEDURE/SUPERVISION AND HANDLING: The pre-loaded needles were then delivered by the urologist under sagittal guidance. A total of 16 needles were used to deposit 60 seeds in the prostate gland. The individual seed activity was 0.346 mCi.  SpaceOAR:  Yes  COMPLEX SIMULATION: At the end of the procedure, an anterior radiograph of the pelvis was obtained to document seed positioning and count. Cystoscopy was performed by the urologist to check the urethra and bladder.  MICRODOSIMETRY: At the end of the procedure, the patient was emitting 0.061 mR/hr at 1 meter. Accordingly, he was considered safe for hospital discharge.  PLAN: The patient will return to the radiation oncology clinic for post implant CT dosimetry in three weeks.   ________________________________  [name], M.D.

## 2024-09-11 ENCOUNTER — Encounter (HOSPITAL_COMMUNITY): Payer: Self-pay | Admitting: Urology

## 2024-09-11 ENCOUNTER — Other Ambulatory Visit: Payer: Self-pay | Admitting: Urology

## 2024-09-11 ENCOUNTER — Other Ambulatory Visit: Payer: Self-pay

## 2024-09-11 DIAGNOSIS — J449 Chronic obstructive pulmonary disease, unspecified: Secondary | ICD-10-CM

## 2024-09-11 DIAGNOSIS — C61 Malignant neoplasm of prostate: Secondary | ICD-10-CM

## 2024-09-11 MED ORDER — TRELEGY ELLIPTA 100-62.5-25 MCG/ACT IN AEPB: 1.0000 | INHALATION_SPRAY | Freq: Every day | RESPIRATORY_TRACT | 11 refills | Status: DC

## 2024-09-11 NOTE — Telephone Encounter (Signed)
 Copied from CRM [id number]. Topic: Clinical - Medication Refill >> Sep 11, 2024 10:04 AM Emylou G wrote: Medication: Fluticasone -Umeclidin-Vilant (TRELEGY ELLIPTA ) 100-62.5-25 MCG/ACT AEPB  Has the patient contacted their pharmacy? No (Agent: If no, request that the patient contact the pharmacy for the refill. If patient does not wish to contact the pharmacy document the reason why and proceed with request.) (Agent: If yes, when and what did the pharmacy advise?)  This is the patient's preferred pharmacy:  [facility] [address] [place], [place] [address] [phone]  Is this the correct pharmacy for this prescription? Yes If no, delete pharmacy and type the correct one.   Has the prescription been filled recently? No  Is the patient out of the medication? Yes  Has the patient been seen for an appointment in the last year OR does the patient have an upcoming appointment? Yes  Can we respond through MyChart? Yes  Agent: Please be advised that Rx refills may take up to 3 business days. We ask that you follow-up with your pharmacy.

## 2024-10-01 ENCOUNTER — Telehealth: Payer: Self-pay | Admitting: *Deleted

## 2024-10-01 NOTE — Progress Notes (Incomplete)
 Post-seed nursing interview for a diagnosis:  73 y.o. gentleman with Stage T1c adenocarcinoma of the prostate with Gleason score of 3+4, and PSA of 5.4.   Patient identity verified x2.   Patient states issues as follows...  -Pain: *** -Fatigue: *** -Abdomen: *** -Groin: *** -Urinary: *** -Bowels: *** -Appetite: *** -Weight: 240.4  Patient denies all other related issues at this time.  Meaningful use complete.  I-PSS (AUA) score- *** - {(BH) RANGE ABSENT/SEVERE:20013:s} SHIM (ED) score- *** Urinary Management medication(s) None Urology appointment date- *** with Dr. Norleen Seltzer at [facility]-   This concludes the interaction.

## 2024-10-01 NOTE — Telephone Encounter (Signed)
 CALLED PATIENT TO REMIND OF MRI AND POST SEED APPTS. FOR 10-02-24, ARRIVAL TIME FOR MRI - 10-02-24 - 7:45 AM @ [facility], AND ARRIVAL TIME @ [facility] FOR POST SEED APPTS. - 8:45 AM, SPOKE WITH PATIENT AND HE IS AWARE OF THESE APPTS. AND THE INSTRUCTIONS

## 2024-10-02 ENCOUNTER — Ambulatory Visit (HOSPITAL_COMMUNITY)
Admission: RE | Admit: 2024-10-02 | Discharge: 2024-10-02 | Disposition: A | Discharge status: Home / Self Care | Source: Ambulatory Visit | Attending: Urology | Admitting: Urology

## 2024-10-02 ENCOUNTER — Encounter: Payer: Self-pay | Admitting: Urology

## 2024-10-02 ENCOUNTER — Telehealth: Payer: Self-pay | Admitting: Radiation Oncology

## 2024-10-02 ENCOUNTER — Ambulatory Visit
Admission: RE | Admit: 2024-10-02 | Discharge: 2024-10-02 | Disposition: A | Discharge status: Home / Self Care | Source: Ambulatory Visit | Attending: Radiation Oncology | Admitting: Radiation Oncology

## 2024-10-02 ENCOUNTER — Ambulatory Visit
Admission: RE | Admit: 2024-10-02 | Discharge: 2024-10-02 | Disposition: A | Discharge status: Home / Self Care | Source: Ambulatory Visit | Attending: Urology | Admitting: Urology

## 2024-10-02 VITALS — BP 110/71 | HR 83 | Temp 97.7°F | Resp 20 | Ht 71.0 in | Wt 240.4 lb

## 2024-10-02 DIAGNOSIS — C61 Malignant neoplasm of prostate: Secondary | ICD-10-CM | POA: Insufficient documentation

## 2024-10-02 NOTE — Telephone Encounter (Signed)
 10/8 Outgoing referral forward to [facility] to Natro D.

## 2024-10-02 NOTE — Progress Notes (Addendum)
 Post-seed nursing interview for a diagnosis:  73 y.o. gentleman with Stage T1c adenocarcinoma of the prostate with Gleason score of 3+4, and PSA of 5.4.   Patient identity verified x2.   Patient states issues as follows...  -Pain: None -Fatigue: None -Abdomen: None -Groin: None -Urinary: Stream is still strong but starts and stops at times while urinating -Bowels: None -Appetite: Good -Weight: 240.4  Patient denies all other related issues at this time.  Meaningful use complete.  I-PSS (AUA) score- 8 - Moderate SHIM (ED) score- 14 Urinary Management medication(s) None Urology appointment date- Pt states he does not have an appt with his urologist Dr. Norleen Seltzer but is seeing the PA next week   Vitals- BP 110/71 (BP Location: Right Arm, Patient Position: Sitting, Cuff Size: Large)   Pulse 83   Temp 97.7 F (36.5 C)   Resp 20   Ht 5' 11 (1.803 m)   Wt 240 lb 6.4 oz (109 kg)   SpO2 91%   BMI 33.53 kg/m    This concludes the interaction.

## 2024-10-02 NOTE — Progress Notes (Signed)
 Radiation Oncology         [phone] ________________________________  Name: Jose Villa MRN: [id number]  Date: 10/02/2024  DOB: Apr 04, 1951  Post-Seed Follow-Up Visit Note  CC: Jose Doffing, FNP  Jose Rush, MD  Diagnosis:   73 y.o. gentleman with Stage T1c adenocarcinoma of the prostate with Gleason score of 3+4, and PSA of 5.4.     ICD-10-CM   1. Malignant neoplasm of prostate (HCC)  C61       Interval Since Last Radiation:  3 weeks 09/10/24:  Insertion of radioactive I-125 seeds into the prostate gland; 145 Gy, definitive therapy with placement of SpaceOAR gel.  Narrative:  The patient returns today for routine follow-up.  He is complaining of increased urinary frequency and urinary hesitation symptoms. He filled out a questionnaire regarding urinary function today providing and overall IPSS score of 8 characterizing his symptoms as mild with intermittency as his biggest complaint.  He specifically denies dysuria, gross hematuria, weak flow of stream or incontinence.  His pre-implant score was 3. He denies any abdominal pain or bowel symptoms.  He reports a healthy appetite and is maintaining his weight.  He has noticed some mild fatigue but overall, is quite pleased with his progress to date.  ALLERGIES:  is allergic to coconut oil fatty acid diethanolamide [cocamide dea].  Meds: Current Outpatient Medications  Medication Sig Dispense Refill   albuterol  (VENTOLIN  HFA) 108 (90 Base) MCG/ACT inhaler Inhale 2 puffs into the lungs every 4 (four) hours as needed for wheezing. 1 each 3   atorvastatin  (LIPITOR) 40 MG tablet Take 1 tablet (40 mg total) by mouth daily. 90 tablet 3   ezetimibe  (ZETIA ) 10 MG tablet Take 1 tablet (10 mg total) by mouth daily. 90 tablet 3   Fluticasone -Umeclidin-Vilant (TRELEGY ELLIPTA ) 100-62.5-25 MCG/ACT AEPB Inhale 1 puff into the lungs daily. 1 each 11   HYDROcodone -acetaminophen  (NORCO/VICODIN) 5-325 MG tablet Take 1 tablet by mouth every 6 (six)  hours as needed for moderate pain (pain score 4-6) or severe pain (pain score 7-10) (for acute postop pain). 6 tablet 0   lisinopril -hydrochlorothiazide  (ZESTORETIC ) 20-12.5 MG tablet Take 1 tablet by mouth daily. 90 tablet 3   Omega-3 Fatty Acids (FISH OIL) 1000 MG CAPS Take 1,000 mg by mouth daily.     Vitamin D , Ergocalciferol , (DRISDOL ) 1.25 MG (50000 UNIT) CAPS capsule Take 1 capsule (50,000 Units total) by mouth every 7 (seven) days. 5 capsule 5   No current facility-administered medications for this visit.    Physical Findings: In general this is a well appearing Caucasian male in no acute distress. He's alert and oriented x4 and appropriate throughout the examination. Cardiopulmonary assessment is negative for acute distress and he exhibits normal effort.   Lab Findings: Lab Results  Component Value Date   WBC 9.0 08/15/2024   HGB 14.4 08/15/2024   HCT 45.0 08/15/2024   MCV 97.0 08/15/2024   PLT 249 08/15/2024    Radiographic Findings:  Patient underwent CT imaging in our clinic for post implant dosimetry. The CT will be fused with his prostate MRI that was performed at 8am this morning and will be reviewed by Dr. [name] to confirm there is an adequate distribution of radioactive seeds throughout the prostate gland and ensure that there are no seeds in or near the rectum. We suspect the final radiation plan and dosimetry will show appropriate coverage of the prostate gland. He understands that we will call and inform him of any unexpected findings on  further review of his imaging and dosimetry.  Impression/Plan: 73 y.o. gentleman with Stage T1c adenocarcinoma of the prostate with Gleason score of 3+4, and PSA of 5.4.  The patient is recovering from the effects of radiation. His urinary symptoms should gradually improve over the next 4-6 months. We talked about this today. He is encouraged by his improvement already and is otherwise pleased with his outcome. We also talked about  long-term follow-up for prostate cancer following seed implant. He understands that ongoing PSA determinations and digital rectal exams will help perform surveillance to rule out disease recurrence. He has a follow up appointment scheduled with Dr. [name] on 10/08/24. He understands what to expect with his PSA measures. Patient was also educated today about some of the long-term effects from radiation including a small risk for rectal bleeding and possibly erectile dysfunction. We talked about some of the general management approaches to these potential complications. However, I did encourage the patient to contact our office or return at any point if he has questions or concerns related to his previous radiation and prostate cancer.    Jose MICAEL Rusk, PA-C

## 2024-10-02 NOTE — Progress Notes (Signed)
  Radiation Oncology         [phone] ________________________________  Name: Jose Villa MRN: [id number]  Date: 10/02/2024  DOB: 1951-05-31  COMPLEX SIMULATION NOTE  NARRATIVE:  The patient was brought to the CT Simulation planning suite today following prostate seed implantation approximately one month ago.  Identity was confirmed.  All relevant records and images related to the planned course of therapy were reviewed.  Then, the patient was set-up supine.  CT images were obtained.  The CT images were loaded into the planning software.  Then the prostate and rectum were contoured.  Treatment planning then occurred.  The implanted iodine 125 seeds were identified by the physics staff for projection of radiation distribution  I have requested : 3D Simulation  I have requested a DVH of the following structures: Prostate and rectum.    ________________________________  [name], M.D.

## 2024-10-07 NOTE — Progress Notes (Signed)
 Impression/Assessment:  Grade group 2 prostate cancer, s/p I 125 brachytherapy/Space OAR 9.16.2025  Plan:    History of Present Illness: 73 year old male, previously been followed by Dr. Norleen Seltzer.  He is here today to discuss his new diagnosis of prostate cancer.  He underwent fusion biopsy on June 14, 2024.  At that time, PSA was 5.4.  He had a PI-RADS 4 lesion in the right prostate.  Prostatic volume was 31 mL on that MRI.  On fusion biopsy, all 3 cores from the region of interest revealed GS 3+3 pattern.  60, 50 and 40% of cores were involved with cancer with perineural invasion seen.  On the systematic biopsy, 5/12 cores revealed adenocarcinoma.  4 cores (right base medial, right mid medial, left mid medial, left apex medial) revealed GS 3+4 pattern 30, 60, 10 and 40% of cores respectively. 1 core (right mid lateral) revealed GS 3+3 pattern and 20% of core.  9.16.2025: Underwent  I-125 brachytherapy with SpaceOAR placement  10.14.2025:  Past Medical History:  Diagnosis Date   Cancer [facility])    Closed fracture of lateral malleolus 12/02/2010   Qualifier: Diagnosis of  By: Margrette MD, Taft     COPD (chronic obstructive pulmonary disease) (HCC)    Elevated PSA    Hearing loss, central     Past Surgical History:  Procedure Laterality Date   FRACTURE SURGERY     right ankle   PROSTATE BIOPSY     RADIOACTIVE SEED IMPLANT N/A 09/10/2024   Procedure: INSERTION, RADIATION SOURCE, PROSTATE;  Surgeon: Seltzer Norleen, MD;  Location: [facility];  Service: Urology;  Laterality: N/A;   SPACE OAR INSTILLATION N/A 09/10/2024   Procedure: INJECTION, HYDROGEL SPACER;  Surgeon: Seltzer Norleen, MD;  Location: [facility];  Service: Urology;  Laterality: N/A;    Home Medications:  Allergies as of 10/08/2024       Reactions   Coconut Oil Fatty Acid Diethanolamide [cocamide Dea] Hives        Medication List        Accurate as of October 07, 2024  4:18 PM. If you have any questions, ask your nurse  or doctor.          albuterol  108 (90 Base) MCG/ACT inhaler Commonly known as: Ventolin  HFA Inhale 2 puffs into the lungs every 4 (four) hours as needed for wheezing.   atorvastatin  40 MG tablet Commonly known as: LIPITOR Take 1 tablet (40 mg total) by mouth daily.   ezetimibe  10 MG tablet Commonly known as: ZETIA  Take 1 tablet (10 mg total) by mouth daily.   Fish Oil 1000 MG Caps Take 1,000 mg by mouth daily.   HYDROcodone -acetaminophen  5-325 MG tablet Commonly known as: NORCO/VICODIN Take 1 tablet by mouth every 6 (six) hours as needed for moderate pain (pain score 4-6) or severe pain (pain score 7-10) (for acute postop pain).   lisinopril -hydrochlorothiazide  20-12.5 MG tablet Commonly known as: Zestoretic  Take 1 tablet by mouth daily.   Trelegy Ellipta  100-62.5-25 MCG/ACT Aepb Generic drug: Fluticasone -Umeclidin-Vilant Inhale 1 puff into the lungs daily.   Vitamin D  (Ergocalciferol ) 1.25 MG (50000 UNIT) Caps capsule Commonly known as: DRISDOL  Take 1 capsule (50,000 Units total) by mouth every 7 (seven) days.        Allergies:  Allergies  Allergen Reactions   Coconut Oil Fatty Acid Diethanolamide [Cocamide Dea] Hives    Family History  Problem Relation Age of Onset   Cancer Mother    Asthma Sister    Breast cancer Sister  Social History:  reports that he quit smoking about 7 years ago. His smoking use included cigarettes. He started smoking about 57 years ago. He has a 75 pack-year smoking history. He has never used smokeless tobacco. He reports current alcohol use. He reports that he does not use drugs.  ROS: A complete review of systems was performed.  All systems are negative except for pertinent findings as noted.  Physical Exam:  Vital signs in last 24 hours: There were no vitals taken for this visit. Constitutional:  Alert and oriented, No acute distress Cardiovascular: Regular rate  Respiratory: Normal respiratory effort Lymphatic: No  lymphadenopathy Neurologic: Grossly intact, no focal deficits Psychiatric: Normal mood and affect  I have reviewed prior pt notes from Dr. Watt  I have reviewed notes from radiation oncology  I have reviewed urinalysis results  I have independently reviewed prior imaging--prostate ultrasound, MRI  I have reviewed prior PSA and pathology results

## 2024-10-08 ENCOUNTER — Encounter: Payer: Self-pay | Admitting: Urology

## 2024-10-08 ENCOUNTER — Ambulatory Visit (INDEPENDENT_AMBULATORY_CARE_PROVIDER_SITE_OTHER): Admitting: Urology

## 2024-10-08 VITALS — BP 99/62 | HR 99

## 2024-10-08 DIAGNOSIS — N401 Enlarged prostate with lower urinary tract symptoms: Secondary | ICD-10-CM | POA: Diagnosis not present

## 2024-10-08 DIAGNOSIS — N138 Other obstructive and reflux uropathy: Secondary | ICD-10-CM

## 2024-10-08 DIAGNOSIS — C61 Malignant neoplasm of prostate: Secondary | ICD-10-CM

## 2024-10-08 LAB — BLADDER SCAN AMB NON-IMAGING: Scan Result: 30

## 2024-10-08 MED ORDER — ALFUZOSIN HCL ER 10 MG PO TB24: 10.0000 mg | ORAL_TABLET | Freq: Every day | ORAL | 11 refills | Status: AC

## 2024-10-08 NOTE — Progress Notes (Unsigned)
 Bladder Scan completed today due to reason BPH with urinary obstruction  Patient can void prior to the bladder scan. Bladder scan result: 30  Performed By: Carlos, CMA  Additional notes- Patient is scheduled to follow up with NA

## 2024-10-09 ENCOUNTER — Encounter: Payer: Self-pay | Admitting: Radiation Oncology

## 2024-10-09 DIAGNOSIS — Z191 Hormone sensitive malignancy status: Secondary | ICD-10-CM | POA: Diagnosis not present

## 2024-10-09 DIAGNOSIS — C61 Malignant neoplasm of prostate: Secondary | ICD-10-CM | POA: Diagnosis not present

## 2024-10-09 NOTE — Radiation Completion Notes (Signed)
 Patient Name: Jose Villa, Jose Villa MRN: [id number] Date of Birth: 1951-09-19 Referring Physician: NORLEEN SELTZER, M.D. Date of Service: 2024-10-09 Radiation Oncologist: Adina Barge, M.D. [facility]                             RADIATION ONCOLOGY END OF TREATMENT NOTE     Diagnosis: C61 Malignant neoplasm of prostate Staging on 2024-06-24: Malignant neoplasm of prostate (HCC) T=cT1c, N=cN0, M=cM0 Intent: Curative     ==========DELIVERED PLANS==========  Prostate Seed Implant Date: 2024-09-10   Plan Name: Prostate Seed Implant Site: Prostate Technique: Radioactive Seed Implant I-125 Mode: Brachytherapy Dose Per Fraction: 145 Gy Prescribed Dose (Delivered / Prescribed): 145 Gy / 145 Gy Prescribed Fxs (Delivered / Prescribed): 1 / 1     ==========ON TREATMENT VISIT DATES========== 2024-09-10     ==========UPCOMING VISITS==========

## 2024-10-09 NOTE — Progress Notes (Signed)
  Radiation Oncology         [phone] ________________________________  Name: Jose Villa MRN: [id number]  Date: 10/09/2024  DOB: Feb 21, 1951  3D Planning Note   Prostate Brachytherapy Post-Implant Dosimetry  Diagnosis:  73 y.o. gentleman with Stage T1c adenocarcinoma of the prostate with Gleason score of 3+4, and PSA of 5.4.   Narrative: On a previous date, Jose Villa returned following prostate seed implantation for post implant planning. He underwent CT scan complex simulation to delineate the three-dimensional structures of the pelvis and demonstrate the radiation distribution.  Since that time, the seed localization, and complex isodose planning with dose volume histograms have now been completed.  Results:   Prostate Coverage - The dose of radiation delivered to the 90% or more of the prostate gland (D90) was 117.41% of the prescription dose. This exceeds our goal of greater than 90%. Rectal Sparing - The volume of rectal tissue receiving the prescription dose or higher was 0.0 cc. This falls under our thresholds tolerance of 1.0 cc.  Impression: The prostate seed implant appears to show adequate target coverage and appropriate rectal sparing.  Plan:  The patient will continue to follow with urology for ongoing PSA determinations. I would anticipate a high likelihood for local tumor control with minimal risk for rectal morbidity.  ________________________________  [name], M.D.

## 2024-10-27 ENCOUNTER — Other Ambulatory Visit: Payer: Self-pay | Admitting: Internal Medicine

## 2024-10-27 ENCOUNTER — Other Ambulatory Visit: Payer: Self-pay | Admitting: Nurse Practitioner

## 2024-10-27 DIAGNOSIS — J449 Chronic obstructive pulmonary disease, unspecified: Secondary | ICD-10-CM

## 2024-10-27 DIAGNOSIS — I10 Essential (primary) hypertension: Secondary | ICD-10-CM

## 2024-12-24 ENCOUNTER — Encounter: Payer: Self-pay | Admitting: *Deleted

## 2024-12-25 ENCOUNTER — Ambulatory Visit: Payer: Self-pay | Admitting: Nurse Practitioner

## 2024-12-25 ENCOUNTER — Encounter: Payer: Self-pay | Admitting: Nurse Practitioner

## 2024-12-25 VITALS — BP 104/65 | HR 108 | Ht 71.0 in | Wt 237.0 lb

## 2024-12-25 DIAGNOSIS — I10 Essential (primary) hypertension: Secondary | ICD-10-CM

## 2024-12-25 DIAGNOSIS — R600 Localized edema: Secondary | ICD-10-CM

## 2024-12-25 MED ORDER — FUROSEMIDE 20 MG PO TABS: 20.0000 mg | ORAL_TABLET | Freq: Every day | ORAL | 3 refills | Status: AC

## 2024-12-25 MED ORDER — LISINOPRIL 20 MG PO TABS: 20.0000 mg | ORAL_TABLET | Freq: Every day | ORAL | 3 refills | Status: DC

## 2024-12-25 NOTE — Patient Instructions (Signed)
 Peripheral Edema  Peripheral edema is swelling that is caused by a buildup of fluid. Peripheral edema most often affects the lower legs, ankles, and feet. It can also develop in the arms, hands, and face. The area of the body that has peripheral edema will look swollen. It may also feel heavy or warm. Your clothes may start to feel tight. Pressing on the area may make a temporary dent in your skin (pitting edema). You may not be able to move your swollen arm or leg as much as usual. There are many causes of peripheral edema. It can happen because of a complication of other conditions such as heart failure, kidney disease, or a problem with your circulation. It also can be a side effect of certain medicines or happen because of an infection. It often happens to women during pregnancy. Sometimes, the cause is not known. Follow these instructions at home: Managing pain, stiffness, and swelling  Raise (elevate) your legs while you are sitting or lying down. Move around often to prevent stiffness and to reduce swelling. Do not sit or stand for long periods of time. Do not wear tight clothing. Do not wear garters on your upper legs. Exercise your legs to get your circulation going. This helps to move the fluid back into your blood vessels, and it may help the swelling go down. Wear compression stockings as told by your health care provider. These stockings help to prevent blood clots and reduce swelling in your legs. It is important that these are the correct size. These stockings should be prescribed by your doctor to prevent possible injuries. If elastic bandages or wraps are recommended, use them as told by your health care provider. Medicines Take over-the-counter and prescription medicines only as told by your health care provider. Your health care provider may prescribe medicine to help your body get rid of excess water (diuretic). Take this medicine if you are told to take it. General  instructions Eat a low-salt (low-sodium) diet as told by your health care provider. Sometimes, eating less salt may reduce swelling. Pay attention to any changes in your symptoms. Moisturize your skin daily to help prevent skin from cracking and draining. Keep all follow-up visits. This is important. Contact a health care provider if: You have a fever. You have swelling in only one leg. You have increased swelling, redness, or pain in one or both of your legs. You have drainage or sores at the area where you have edema. Get help right away if: You have edema that starts suddenly or is getting worse, especially if you are pregnant or have a medical condition. You develop shortness of breath, especially when you are lying down. You have pain in your chest or abdomen. You feel weak. You feel like you will faint. These symptoms may be an emergency. Get help right away. Call 911. Do not wait to see if the symptoms will go away. Do not drive yourself to the hospital. Summary Peripheral edema is swelling that is caused by a buildup of fluid. Peripheral edema most often affects the lower legs, ankles, and feet. Move around often to prevent stiffness and to reduce swelling. Do not sit or stand for long periods of time. Pay attention to any changes in your symptoms. Contact a health care provider if you have edema that starts suddenly or is getting worse, especially if you are pregnant or have a medical condition. Get help right away if you develop shortness of breath, especially when lying down.  This information is not intended to replace advice given to you by your health care provider. Make sure you discuss any questions you have with your health care provider. Document Revised: 08/16/2021 Document Reviewed: 08/16/2021 Elsevier Patient Education  2024 [employer].

## 2024-12-25 NOTE — Progress Notes (Signed)
 "  Subjective:    Patient ID: Ramzi Brathwaite Flippo, male    DOB: 22-Apr-1951, 73 y.o.   MRN: [id number]   Chief Complaint: lower ext swelling  HPI  Patient in today c/o bil foot swelling. Started a few weeks ago. Gets some better at night. Denies SOB or chest pain.  Lab Results  Component Value Date   NA 136 08/15/2024   K 3.9 08/15/2024   CO2 28 08/15/2024   GLUCOSE 123 (H) 08/15/2024   BUN 19 08/15/2024   CREATININE 1.05 08/15/2024   CALCIUM  10.3 08/15/2024   EGFR 69 08/01/2024   GFRNONAA >60 08/15/2024     Patient Active Problem List   Diagnosis Date Noted   Vitamin D  deficiency 07/23/2024   Malignant neoplasm of prostate (HCC) 07/11/2024   Controlled diabetes mellitus type 2 with complications (HCC) 06/08/2022   Elevated PSA, less than 10 ng/ml 06/08/2022   Annual physical exam 06/08/2022   Elevated TSH 06/08/2022   Need for varicella vaccine 06/08/2022   Hypertension, essential 02/17/2021   Hyperlipidemia 02/17/2021   Screening due 02/03/2021   Obesity 09/26/2011   Encounter to establish care 09/26/2011   LOSS, CENTRAL HEARING 11/28/2006   COPD (chronic obstructive pulmonary disease) (HCC) 11/28/2006        Review of Systems  Constitutional:  Negative for diaphoresis.  Eyes:  Negative for pain.  Respiratory:  Negative for shortness of breath.   Cardiovascular:  Negative for chest pain, palpitations and leg swelling.  Gastrointestinal:  Negative for abdominal pain.  Endocrine: Negative for polydipsia.  Skin:  Negative for rash.  Neurological:  Negative for dizziness, weakness and headaches.  Hematological:  Does not bruise/bleed easily.  All other systems reviewed and are negative.      Objective:   Physical Exam Constitutional:      Appearance: Normal appearance. He is obese.  Cardiovascular:     Rate and Rhythm: Normal rate and regular rhythm.     Heart sounds: Normal heart sounds.  Pulmonary:     Effort: Pulmonary effort is normal.     Breath  sounds: Normal breath sounds.  Musculoskeletal:     Right lower leg: Edema (1+ feet and ankles) present.     Left lower leg: Edema (1+feet and ankle) present.  Skin:    General: Skin is warm.  Neurological:     General: No focal deficit present.     Mental Status: He is alert and oriented to person, place, and time.  Psychiatric:        Mood and Affect: Mood normal.        Behavior: Behavior normal.    BP 104/65   Pulse (!) 108   Ht 5' 11 (1.803 m)   Wt 237 lb (107.5 kg)   SpO2 97%   BMI 33.05 kg/m         Assessment & Plan:   Aswad Wandrey Larocque in today with chief complaint of Foot Swelling (Bilateral feet swelling)   1. Lower extremity edema (Primary) Labs pending Start lasix  Compression socks Elevate feet when sitting - BMP8+EGFR - Brain natriuretic peptide - furosemide (LASIX) 20 MG tablet; Take 1 tablet (20 mg total) by mouth daily.  Dispense: 30 tablet; Refill: 3  2. Hypertension, essential Dash diet - lisinopril  (ZESTRIL ) 20 MG tablet; Take 1 tablet (20 mg total) by mouth daily.  Dispense: 90 tablet; Refill: 3    The above assessment and management plan was discussed with the patient. The patient verbalized understanding  of and has agreed to the management plan. Patient is aware to call the clinic if symptoms persist or worsen. Patient is aware when to return to the clinic for a follow-up visit. Patient educated on when it is appropriate to go to the emergency department.   Mary-Margaret Gladis, FNP   "

## 2024-12-27 ENCOUNTER — Ambulatory Visit: Payer: Self-pay | Admitting: Nurse Practitioner

## 2024-12-27 LAB — BMP8+EGFR
BUN/Creatinine Ratio: 17 (ref 10–24)
BUN: 19 mg/dL (ref 8–27)
CO2: 26 mmol/L (ref 20–29)
Calcium: 10.8 mg/dL — ABNORMAL HIGH (ref 8.6–10.2)
Chloride: 95 mmol/L — ABNORMAL LOW (ref 96–106)
Creatinine, Ser: 1.11 mg/dL (ref 0.76–1.27)
Glucose: 99 mg/dL (ref 70–99)
Potassium: 4.5 mmol/L (ref 3.5–5.2)
Sodium: 136 mmol/L (ref 134–144)
eGFR: 71 mL/min/1.73

## 2024-12-27 LAB — BRAIN NATRIURETIC PEPTIDE: BNP: 27.6 pg/mL (ref 0.0–100.0)

## 2025-01-08 ENCOUNTER — Ambulatory Visit: Payer: Self-pay | Admitting: Nurse Practitioner

## 2025-01-08 ENCOUNTER — Encounter: Payer: Self-pay | Admitting: Nurse Practitioner

## 2025-01-08 VITALS — BP 93/63 | HR 118 | Ht 71.0 in | Wt 235.0 lb

## 2025-01-08 DIAGNOSIS — J449 Chronic obstructive pulmonary disease, unspecified: Secondary | ICD-10-CM

## 2025-01-08 DIAGNOSIS — I10 Essential (primary) hypertension: Secondary | ICD-10-CM | POA: Diagnosis not present

## 2025-01-08 DIAGNOSIS — R600 Localized edema: Secondary | ICD-10-CM | POA: Diagnosis not present

## 2025-01-08 DIAGNOSIS — R0602 Shortness of breath: Secondary | ICD-10-CM

## 2025-01-08 MED ORDER — ALBUTEROL SULFATE HFA 108 (90 BASE) MCG/ACT IN AERS: 2.0000 | INHALATION_SPRAY | Freq: Four times a day (QID) | RESPIRATORY_TRACT | 2 refills | Status: AC | PRN

## 2025-01-08 MED ORDER — LOSARTAN POTASSIUM 100 MG PO TABS: 100.0000 mg | ORAL_TABLET | Freq: Every day | ORAL | 1 refills | Status: AC

## 2025-01-08 NOTE — Patient Instructions (Signed)
 COPD and Physical Activity Chronic obstructive pulmonary disease (COPD) is a long-term, or chronic, condition that affects the lungs. COPD is a general term that can be used to describe many problems that cause inflammation of the lungs and limit airflow. These conditions include chronic bronchitis and emphysema. The main symptom of COPD is shortness of breath, which makes it harder to do even simple tasks. This can also make it harder to exercise and stay active. Talk with your health care provider about treatments to help you breathe better and actions you can take to prevent breathing problems during physical activity. What are the benefits of exercising when you have COPD? Exercising regularly is an important part of a healthy lifestyle. You can still exercise and do physical activities even though you have COPD. Exercise and physical activity improve your shortness of breath by increasing blood flow (circulation). This causes your heart to pump more oxygen through your body. Moderate exercise can: Improve oxygen use. Increase your energy level. Help with shortness of breath. Strengthen your breathing muscles. Improve heart health. Help with sleep. Improve your self-esteem and feelings of self-worth. Lower depression, stress, and anxiety. Exercise can benefit everyone with COPD. The severity of your disease may affect how hard you can exercise, especially at first, but everyone can benefit. Talk with your health care provider about how much exercise is safe for you, and which activities and exercises are safe for you. What actions can I take to prevent breathing problems during physical activity? Sign up for a pulmonary rehabilitation program. This type of program may include: Education about lung diseases. Exercise classes that teach you how to exercise and be more active while improving your breathing. This usually involves: Exercise using your lower extremities, such as a stationary  bicycle. About 30 minutes of exercise, 2 to 5 times per week, for 6 to 12 weeks. Strength training, such as push-ups or leg lifts. Nutrition education. Group classes in which you can talk with others who also have COPD and learn ways to manage stress. If you use an oxygen tank, you should use it while you exercise. Work with your health care provider to adjust your oxygen for your physical activity. Your resting flow rate is different from your flow rate during physical activity. How to manage your breathing while exercising While you are exercising: Take slow breaths. Pace yourself, and do nottry to go too fast. Purse your lips while breathing out. Pursing your lips is similar to a kissing or whistling position. If doing exercise that uses a quick burst of effort, such as weight lifting: Breathe in before starting the exercise. Breathe out during the hardest part of the exercise, such as raising the weights. Where to find support You can find support for exercising with COPD from: Your health care provider. A pulmonary rehabilitation program. Your local health department or community health programs. Support groups, either online or in-person. Your health care provider may be able to recommend support groups. Where to find more information You can find more information about exercising with COPD from: American Lung Association: lung.org COPD Foundation: copdfoundation.org Contact a health care provider if: Your symptoms get worse. You have nausea. You have a fever. You want to start a new exercise program or a new activity. Get help right away if: You have chest pain. You cannot breathe. These symptoms may represent a serious problem that is an emergency. Do not wait to see if the symptoms will go away. Get medical help right away. Call  your local emergency services (911 in the [place] Do not drive yourself to the hospital. Summary COPD is a general term that can be used to describe  many different lung problems that cause lung inflammation and limit airflow. This includes chronic bronchitis and emphysema. Exercise and physical activity improve your shortness of breath by increasing blood flow (circulation). This causes your heart to provide more oxygen to your body. Contact your health care provider before starting any exercise program or new activity. Ask your health care provider what exercises and activities are safe for you. This information is not intended to replace advice given to you by your health care provider. Make sure you discuss any questions you have with your health care provider. Document Revised: 10/27/2023 Document Reviewed: 10/27/2023 Elsevier Patient Education  2024 [employer].

## 2025-01-08 NOTE — Progress Notes (Signed)
 "  Subjective:    Patient ID: Jose Villa, male    DOB: Jan 18, 1951, 74 y.o.   MRN: [id number]   Chief Complaint: hypertension and edema  HPI  Patient was seen on 12/25/24 c/o lower ext edema. We changed his lisinopril /HCTZ to plain lisinopril . Added daily lasix . Since then blood pressure has been running around 100-140. Peripheral edema has gone down.  - has had cough and sob. Uses trelogy daily. Has need albuterol  almost daily. He stopped smoking 6-7 years ago. 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  Eyes:  Negative for pain.  Respiratory:  Positive for cough (occassional). Negative for shortness of breath.   Cardiovascular:  Negative for chest pain, palpitations and leg swelling.  Gastrointestinal:  Negative for abdominal pain.  Endocrine: Negative for polydipsia.  Skin:  Negative for rash.  Neurological:  Negative for dizziness, weakness and headaches.  Hematological:  Does not bruise/bleed easily.  All other systems reviewed and are negative.      Objective:   Physical Exam Constitutional:      Appearance: Normal appearance.  Cardiovascular:     Rate and Rhythm: Normal rate and regular rhythm.     Heart sounds: Normal heart sounds.  Pulmonary:     Effort: Pulmonary  effort is normal.     Breath sounds: Wheezing (faint exp wheezes) present.  Musculoskeletal:     Right lower leg: Edema (1+) present.     Left lower leg: Edema (1+) present.  Skin:    General: Skin is warm.  Neurological:     General: No focal deficit present.     Mental Status: He is alert and oriented to person, place, and time.  Psychiatric:        Mood and Affect: Mood normal.        Behavior: Behavior normal.    BP 93/63   Pulse (!) 118   Ht 5' 11 (1.803 m)   Wt 235 lb (106.6 kg)   SpO2 (!) 86%   BMI 32.78 kg/m         Assessment & Plan:   Elsie DASEN Centrella in today with chief complaint of Edema (Recheck ) and Hypertension (Recheck )   1. Hypertension, essential (Primary) Stop lisinopril  due to chronic cough Start on losartan  as prescribed Keep check of blood pressure at home - losartan  (COZAAR ) 100 MG tablet; Take 1 tablet (100 mg total) by mouth daily.  Dispense: 90 tablet; Refill: 1  2. Peripheral edema Continue lasix  Elevate legs when sitting  3. Chronic obstructive pulmonary disease, unspecified COPD type (HCC) Referral to pulmonology - albuterol  (VENTOLIN  HFA) 108 (90 Base) MCG/ACT inhaler; Inhale 2 puffs into the lungs every 6 (six) hours as needed for wheezing or shortness of breath.  Dispense: 6.7  g; Refill: 2 - Ambulatory referral to Pulmonology    The above assessment and management plan was discussed with the patient. The patient verbalized understanding of and has agreed to the management plan. Patient is aware to call the clinic if symptoms persist or worsen. Patient is aware when to return to the clinic for a follow-up visit. Patient educated on when it is appropriate to go to the emergency department.   Mary-Margaret Gladis, FNP   "

## 2025-01-30 ENCOUNTER — Other Ambulatory Visit: Payer: Self-pay | Admitting: Internal Medicine

## 2025-01-30 ENCOUNTER — Ambulatory Visit (HOSPITAL_COMMUNITY)
Admission: RE | Admit: 2025-01-30 | Discharge: 2025-01-30 | Disposition: A | Source: Ambulatory Visit | Attending: Internal Medicine | Admitting: Internal Medicine

## 2025-01-30 ENCOUNTER — Ambulatory Visit: Admitting: Internal Medicine

## 2025-01-30 ENCOUNTER — Encounter: Payer: Self-pay | Admitting: Internal Medicine

## 2025-01-30 VITALS — BP 96/58 | HR 103 | Ht 71.0 in | Wt 235.0 lb

## 2025-01-30 DIAGNOSIS — J449 Chronic obstructive pulmonary disease, unspecified: Secondary | ICD-10-CM

## 2025-01-30 DIAGNOSIS — I10 Essential (primary) hypertension: Secondary | ICD-10-CM

## 2025-01-30 DIAGNOSIS — R0609 Other forms of dyspnea: Secondary | ICD-10-CM | POA: Insufficient documentation

## 2025-01-30 DIAGNOSIS — E782 Mixed hyperlipidemia: Secondary | ICD-10-CM

## 2025-01-30 MED ORDER — ALBUTEROL SULFATE (2.5 MG/3ML) 0.083% IN NEBU: 2.5000 mg | INHALATION_SOLUTION | RESPIRATORY_TRACT | 12 refills | Status: AC | PRN

## 2025-01-30 MED ORDER — BREZTRI AEROSPHERE 160-9-4.8 MCG/ACT IN AERO: 2.0000 | INHALATION_SPRAY | Freq: Two times a day (BID) | RESPIRATORY_TRACT | Status: AC

## 2025-01-30 MED ORDER — BUDESONIDE-FORMOTEROL FUMARATE 80-4.5 MCG/ACT IN AERO: INHALATION_SPRAY | RESPIRATORY_TRACT | 12 refills | Status: AC

## 2025-01-30 NOTE — Assessment & Plan Note (Addendum)
 Bp too low rec cut losartan  100 mg in half and just take 50 mg daily until f/u with PCP  Each maintenance medication was reviewed in detail including emphasizing most importantly the difference between maintenance and prns and under what circumstances the prns are to be triggered using an action plan format where appropriate.  Total time for H and P, chart review, counseling, reviewing hfa/ neb  device(s) , directly observing portions of ambulatory 02 saturation study/ and generating customized AVS unique to this office visit / same day charting = 64 min new pt eval For patient with multiple chronic  refractory respiratory  symptoms ? Source?

## 2025-01-30 NOTE — Patient Instructions (Addendum)
 Plan A = Automatic = Always=    Symbicort  80 (or Breztri  yellow) Take 2 puffs first thing in am and then another 2 puffs about 12 hours later.    Work on inhaler technique:  relax and gently blow all the way out then take a nice smooth full deep breath back in, triggering the inhaler at same time you start breathing in.  Hold breath in for at least  5 seconds if you can. Blow out symbicort / breztri )  thru nose. Rinse and gargle with water when done.  If mouth or throat bother you at all,  try brushing teeth/gums/tongue with arm and hammer toothpaste/ make a slurry and gargle and spit out.   >>>  Remember how golfers warm up by taking practice swings - do this with an empty inhaler    Plan B = Backup (to supplement plan A, not to replace it) Use your albuterol  inhaler as a rescue medication to be used if you can't catch your breath by resting or slowing your pace  or doing a relaxed purse lip breathing pattern.  - The less you use it, the better it will work when you need it. - Ok to use the inhaler up to 2 puffs  every 4 hours if you must but call for appointment if use goes up over your usual need - Don't leave home without it !!  (think of it like the spare tire or starter fluid for your car)    Plan C = Crisis (instead of Plan B but only if Plan B stops working) - only use your albuterol  nebulizer if you first try Plan B and it fails to help > ok to use the nebulizer up to every 4 hours but if start needing it regularly call for immediate appointment  Please remember to go to the lab department   for your tests - we will call you with the results when they are available.     Please remember to go to the  x-ray department  @  [facility] for your tests - we will call you with the results when they are available     Break losartan  in half (100 mg pill break in half will make it only 50 mg)  Stop the trelegy   For cough > mucinex dm 1200 mg (over the counter)  every 12 hours and  supplement  with your vicodin one every 6 hours if needed if not driving.    Please schedule a follow up office visit in 4 weeks, sooner if needed  with all medications /inhalers/ solutions in hand so we can verify exactly what you are taking. This includes all medications from all doctors and over the counters

## 2025-01-30 NOTE — Progress Notes (Unsigned)
 "   Jose Villa, male    DOB: [birth year]    MRN: [id number]   Brief patient profile:  73  yowm  quit smoking 2018 due pna at wt < 200 lb  referred to pulmonary clinic in [place]  01/30/2025 by Leita Longs NP  for doe  with coughing fits   Pt not previously seen by PCCM service.    PFT's  9/01/01/11   FEV1 1.84 (52 % ) ratio 0.51  p 5 % improvement from saba p 0 prior to study with DLCO  21 (83%)   and FV curve classically concave  undetected wt 203      History of Present Illness  01/30/2025  Pulmonary/ 1st office eval/ [facility] trelegy  100 @ wt 235  Chief Complaint  Patient presents with   Establish Care    Doe / rescue inhalers doesn't do much.  Coughing   Dyspnea:  [employer] shopping ok slt slower/ 5 steps to get in house ok  Cough: non-productive s pattern  Sleep: bed is flat/sleep on side 2 pillows  SABA use: proair  doesn't help much but he is not using hfa correctly  02: none     No obvious day to day or daytime pattern/variability or assoc excess/ purulent sputum or mucus plugs or hemoptysis or cp or chest tightness, subjective wheeze or overt sinus or hb symptoms.    Also denies any obvious fluctuation of symptoms with weather or environmental changes or other aggravating or alleviating factors except as outlined above   No unusual exposure hx or h/o childhood pna/ asthma or knowledge of premature birth.  Current Allergies, Complete Past Medical History, Past Surgical History, Family History, and Social History were reviewed in [employer] record.  ROS  The following are not active complaints unless bolded Hoarseness, sore throat, dysphagia, dental problems, itching, sneezing,  nasal congestion or discharge of excess mucus or purulent secretions, ear ache,   fever, chills, sweats, unintended wt loss or wt gain, classically pleuritic or exertional cp,  orthopnea pnd or arm/hand swelling  or leg swelling, presyncope, palpitations,  abdominal pain, anorexia, nausea, vomiting, diarrhea  or change in bowel habits or change in bladder habits, change in stools or change in urine, dysuria, hematuria,  rash, arthralgias, visual complaints, headache, numbness, weakness or ataxia or problems with walking or coordination,  change in mood or  memory.            Outpatient Medications Prior to Visit  Medication Sig Dispense Refill   albuterol  (VENTOLIN  HFA) 108 (90 Base) MCG/ACT inhaler Inhale 2 puffs into the lungs every 6 (six) hours as needed for wheezing or shortness of breath. 6.7 g 2   alfuzosin  (UROXATRAL ) 10 MG 24 hr tablet Take 1 tablet (10 mg total) by mouth daily with breakfast. 30 tablet 11   atorvastatin  (LIPITOR) 40 MG tablet Take 1 tablet (40 mg total) by mouth daily. 90 tablet 3   ezetimibe  (ZETIA ) 10 MG tablet Take 1 tablet (10 mg total) by mouth daily. 90 tablet 3   Fluticasone -Umeclidin-Vilant (TRELEGY ELLIPTA ) 100-62.5-25 MCG/ACT AEPB Inhale 1 puff into the lungs daily. 1 each 11   furosemide  (LASIX ) 20 MG tablet Take 1 tablet (20 mg total) by mouth daily. 30 tablet 3   HYDROcodone -acetaminophen  (NORCO/VICODIN) 5-325 MG tablet Take 1 tablet by mouth every 6 (six) hours as needed for moderate pain (pain score 4-6) or severe pain (pain score 7-10) (for acute postop pain). 6  tablet 0   losartan  (COZAAR ) 100 MG tablet Take 1 tablet (100 mg total) by mouth daily. 90 tablet 1   Omega-3 Fatty Acids (FISH OIL) 1000 MG CAPS Take 1,000 mg by mouth daily.     Vitamin D , Ergocalciferol , (DRISDOL ) 1.25 MG (50000 UNIT) CAPS capsule Take 1 capsule (50,000 Units total) by mouth every 7 (seven) days. 5 capsule 5   No facility-administered medications prior to visit.    Past Medical History:  Diagnosis Date   Cancer [facility])    Closed fracture of lateral malleolus 12/02/2010   Qualifier: Diagnosis of  By: Margrette MD, Taft     COPD (chronic obstructive pulmonary disease) (HCC)    Elevated PSA    Hearing loss, central        Objective:     BP (!) 96/58   Pulse (!) 103   Ht 5' 11 (1.803 m)   Wt 235 lb (106.6 kg)   SpO2 (!) 86% Comment: ra  BMI 32.78 kg/m   SpO2: (!) 86 % (ra) mod obese (by BMI) amb wm nad / very poor insight into meds  HEENT : Oropharynx  clear      NECK :  without  apparent JVD/ palpable Nodes/TM    LUNGS: no acc muscle use,  Mild barrel  contour chest wall with bilateral  Distant bs s audible wheeze and  without cough on insp or exp maneuvers  and mild  Hyperresonant  to  percussion bilaterally     CV:  RRR  no s3 or murmur or increase in P2, and no edema   ABD:  quite obese soft and nontender   MS:  Nl gait/ ext warm without deformities Or obvious joint restrictions  calf tenderness, cyanosis or clubbing     SKIN: warm and dry without lesions    NEURO:  alert, approp, nl sensorium with  no motor or cerebellar deficits apparent.    CXR PA and Lateral:   01/30/2025 :    I personally reviewed images and impression is as follows:        Assessment   Assessment & Plan Stage 2 moderate COPD by GOLD classification (HCC) Quit smoking 2018 - PFT's  9/01/01/11   FEV1 1.84 (52 % ) ratio 0.51  p 5 % improvement from saba p 0 prior to study with DLCO  21 (83%)   and FV curve classically concave  undetected wt 203   - resting 02 sats 88% 01/30/2025 but refused 02   - 01/30/2025  After extensive coaching inhaler device,  effectiveness =    50% at best > try breztri  sample and since insurance has been an issue with it previously and his main cc = dry cough will use low dose symbicort  and neb albuterol  to start with  Re SABA :  I spent extra time with pt today reviewing appropriate use of albuterol  for prn use on exertion with the following points: 1) saba is for relief of sob that does not improve by walking a slower pace or resting but rather if the pt does not improve after trying this first. 2) If the pt is convinced, as many are, that saba helps recover from activity faster then  it's easy to tell if this is the case by re-challenging : ie stop, take the inhaler, then p 5 minutes try the exact same activity (intensity of workload) that just caused the symptoms and see if they are substantially diminished or not after saba 3) if  there is an activity that reproducibly causes the symptoms, try the saba 15 min before the activity on alternate days   If in fact the saba really does help, then fine to continue to use it prn but advised may need to look closer at the maintenance regimen being used to achieve better control of airways disease with exertion  Re cough:  ? Could be from DPI so try off trelegy and on mucinex dm supplement prn with vicodin if not driving    DOE (dyspnea on exertion) 01/30/2025   Walked on RA  x  2  lap(s) =  approx 300  ft  @ slow pace, stopped due to  lowest 02 sats 77% s doe or cp and refused 02  >>>  Doe labs sent 01/30/2025   Comment: He is not as sob as pfts/ sats would suggest he should be indicating he's either hypothyroid or just habituated  to this level of hypoxemia and airflow obstruction.     Hypertension, essential Bp too low rec cut losartan  100 mg in half and just take 50 mg daily until f/u with PCP  Each maintenance medication was reviewed in detail including emphasizing most importantly the difference between maintenance and prns and under what circumstances the prns are to be triggered using an action plan format where appropriate.  Total time for H and P, chart review, counseling, reviewing hfa/ neb  device(s) , directly observing portions of ambulatory 02 saturation study/ and generating customized AVS unique to this office visit / same day charting = 64 min new pt eval For patient with multiple chronic  refractory respiratory  symptoms ? Source?    AVS  Patient Instructions  Plan A = Automatic = Always=    Symbicort  80 (or Breztri  yellow) Take 2 puffs first thing in am and then another 2 puffs about 12 hours later.    Work on  inhaler technique:  relax and gently blow all the way out then take a nice smooth full deep breath back in, triggering the inhaler at same time you start breathing in.  Hold breath in for at least  5 seconds if you can. Blow out symbicort / breztri )  thru nose. Rinse and gargle with water when done.  If mouth or throat bother you at all,  try brushing teeth/gums/tongue with arm and hammer toothpaste/ make a slurry and gargle and spit out.   >>>  Remember how golfers warm up by taking practice swings - do this with an empty inhaler    Plan B = Backup (to supplement plan A, not to replace it) Use your albuterol  inhaler as a rescue medication to be used if you can't catch your breath by resting or slowing your pace  or doing a relaxed purse lip breathing pattern.  - The less you use it, the better it will work when you need it. - Ok to use the inhaler up to 2 puffs  every 4 hours if you must but call for appointment if use goes up over your usual need - Don't leave home without it !!  (think of it like the spare tire or starter fluid for your car)    Plan C = Crisis (instead of Plan B but only if Plan B stops working) - only use your albuterol  nebulizer if you first try Plan B and it fails to help > ok to use the nebulizer up to every 4 hours but if start needing it regularly call for immediate appointment  Please remember to go to the lab department   for your tests - we will call you with the results when they are available.     Please remember to go to the  x-ray department  @  [facility] for your tests - we will call you with the results when they are available     Break losartan  in half (100 mg pill break in half will make it only 50 mg)  Stop the trelegy   For cough > mucinex dm 1200 mg (over the counter)  every 12 hours and supplement  with your vicodin one every 6 hours if needed if not driving.    Please schedule a follow up office visit in 4 weeks, sooner if needed  with all  medications /inhalers/ solutions in hand so we can verify exactly what you are taking. This includes all medications from all doctors and over the counters       Ozell America, MD 01/30/2025      "

## 2025-01-30 NOTE — Assessment & Plan Note (Addendum)
 Quit smoking 2018 - PFT's  9/01/01/11   FEV1 1.84 (52 % ) ratio 0.51  p 5 % improvement from saba p 0 prior to study with DLCO  21 (83%)   and FV curve classically concave  undetected wt 203   - resting 02 sats 88% 01/30/2025 but refused 02   - 01/30/2025  After extensive coaching inhaler device,  effectiveness =    50% at best > try breztri  sample and since insurance has been an issue with it previously and his main cc = dry cough will use low dose symbicort  and neb albuterol  to start with  Re SABA :  I spent extra time with pt today reviewing appropriate use of albuterol  for prn use on exertion with the following points: 1) saba is for relief of sob that does not improve by walking a slower pace or resting but rather if the pt does not improve after trying this first. 2) If the pt is convinced, as many are, that saba helps recover from activity faster then it's easy to tell if this is the case by re-challenging : ie stop, take the inhaler, then p 5 minutes try the exact same activity (intensity of workload) that just caused the symptoms and see if they are substantially diminished or not after saba 3) if there is an activity that reproducibly causes the symptoms, try the saba 15 min before the activity on alternate days   If in fact the saba really does help, then fine to continue to use it prn but advised may need to look closer at the maintenance regimen being used to achieve better control of airways disease with exertion  Re cough:  ? Could be from DPI so try off trelegy and on mucinex dm supplement prn with vicodin if not driving

## 2025-01-30 NOTE — Assessment & Plan Note (Addendum)
 01/30/2025   Walked on RA  x  2  lap(s) =  approx 300  ft  @ slow pace, stopped due to  lowest 02 sats 77% s doe or cp and refused 02  >>>  Doe labs sent 01/30/2025   Comment: He is not as sob as pfts/ sats would suggest he should be indicating he's either hypothyroid or just habituated  to this level of hypoxemia and airflow obstruction.

## 2025-01-31 LAB — D-DIMER, QUANTITATIVE: D-DIMER: 1.18 mg{FEU}/L — ABNORMAL HIGH (ref 0.00–0.49)

## 2025-02-28 ENCOUNTER — Ambulatory Visit: Admitting: Internal Medicine

## 2025-03-03 ENCOUNTER — Inpatient Hospital Stay: Admitting: *Deleted

## 2025-03-03 ENCOUNTER — Ambulatory Visit

## 2025-03-14 ENCOUNTER — Other Ambulatory Visit

## 2025-03-18 ENCOUNTER — Ambulatory Visit: Payer: Self-pay

## 2025-03-18 ENCOUNTER — Ambulatory Visit: Admitting: Urology

## 2025-04-09 ENCOUNTER — Ambulatory Visit: Payer: Self-pay | Admitting: Nurse Practitioner
